# Patient Record
Sex: Male | Born: 1960 | Race: White | Hispanic: No | Marital: Married | Smoking: Never smoker
Health system: Southern US, Community
[De-identification: ages and names within clinical notes are randomized; demographics above are authoritative.]

## PROBLEM LIST (undated history)

## (undated) DIAGNOSIS — E785 Hyperlipidemia, unspecified: Secondary | ICD-10-CM

## (undated) DIAGNOSIS — K635 Polyp of colon: Secondary | ICD-10-CM

## (undated) DIAGNOSIS — L570 Actinic keratosis: Secondary | ICD-10-CM

## (undated) DIAGNOSIS — Z860100 Personal history of colon polyps, unspecified: Secondary | ICD-10-CM

## (undated) DIAGNOSIS — Z806 Family history of leukemia: Secondary | ICD-10-CM

## (undated) DIAGNOSIS — Z803 Family history of malignant neoplasm of breast: Secondary | ICD-10-CM

## (undated) DIAGNOSIS — Z800 Family history of malignant neoplasm of digestive organs: Secondary | ICD-10-CM

## (undated) DIAGNOSIS — I10 Essential (primary) hypertension: Secondary | ICD-10-CM

## (undated) DIAGNOSIS — D229 Melanocytic nevi, unspecified: Secondary | ICD-10-CM

## (undated) DIAGNOSIS — R42 Dizziness and giddiness: Secondary | ICD-10-CM

## (undated) DIAGNOSIS — Z8042 Family history of malignant neoplasm of prostate: Secondary | ICD-10-CM

## (undated) DIAGNOSIS — E119 Type 2 diabetes mellitus without complications: Secondary | ICD-10-CM

## (undated) DIAGNOSIS — K2270 Barrett's esophagus without dysplasia: Secondary | ICD-10-CM

## (undated) HISTORY — PX: ESOPHAGOGASTRODUODENOSCOPY: SHX1529

## (undated) HISTORY — PX: VASECTOMY: SHX75

## (undated) HISTORY — PX: COLONOSCOPY: SHX174

## (undated) HISTORY — DX: Family history of malignant neoplasm of breast: Z80.3

## (undated) HISTORY — DX: Type 2 diabetes mellitus without complications: E11.9

## (undated) HISTORY — DX: Melanocytic nevi, unspecified: D22.9

## (undated) HISTORY — DX: Family history of malignant neoplasm of prostate: Z80.42

## (undated) HISTORY — DX: Family history of leukemia: Z80.6

## (undated) HISTORY — DX: Family history of malignant neoplasm of digestive organs: Z80.0

## (undated) HISTORY — DX: Personal history of colonic polyps: Z86.010

## (undated) HISTORY — DX: Personal history of colon polyps, unspecified: Z86.0100

## (undated) HISTORY — DX: Actinic keratosis: L57.0

---

## 2007-05-09 ENCOUNTER — Encounter: Payer: Self-pay | Admitting: Dermatology

## 2007-05-12 ENCOUNTER — Ambulatory Visit: Payer: Self-pay | Admitting: Unknown Physician Specialty

## 2009-06-13 ENCOUNTER — Ambulatory Visit: Payer: Self-pay | Admitting: Unknown Physician Specialty

## 2010-02-09 ENCOUNTER — Ambulatory Visit: Payer: Self-pay | Admitting: Internal Medicine

## 2010-02-09 ENCOUNTER — Inpatient Hospital Stay: Payer: Self-pay | Admitting: Surgery

## 2011-06-25 ENCOUNTER — Ambulatory Visit: Payer: Self-pay | Admitting: Unknown Physician Specialty

## 2011-06-30 LAB — PATHOLOGY REPORT

## 2011-07-12 IMAGING — CT CT ABD-PELV W/ CM
1 of 2 series · 15 of 32 positions shown, 19 images · IV contrast (isovue)
Comparison: None

REASON FOR EXAM: ADD ON CR  [id number] LLQ  and abd pain
COMMENTS:

PROCEDURE:     CT  - CT ABDOMEN / PELVIS  W  - February 09, 2010  [DATE]
RESULT:     History: Abdominal pain
TECHNIQUE: Multiple axial images of the abdomen and pelvis were performed
from the lung bases to the pubic symphysis, with p.o. contrast and with 100
ml of Isovue 370 intravenous contrast.

[Series 2: 3mm soft tissue · axial · 0.79mm/px · z∈[-590,-94]mm · 15 of 181 slices shown, 19 images]
[im 8/181  soft-tissue]
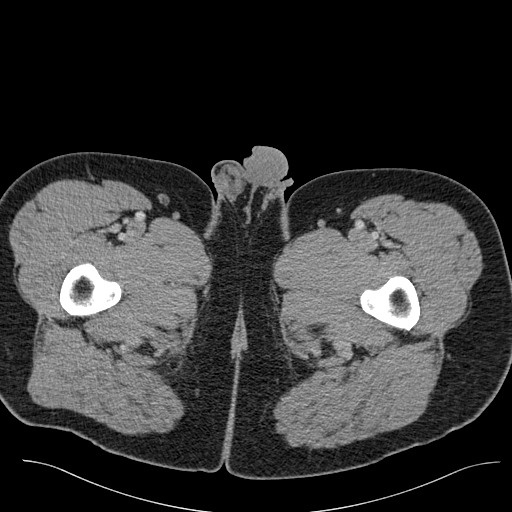
[im 8/181  bone]
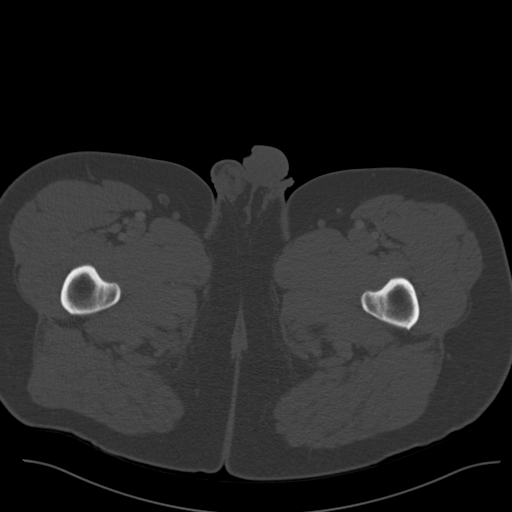
[im 24/181  soft-tissue]
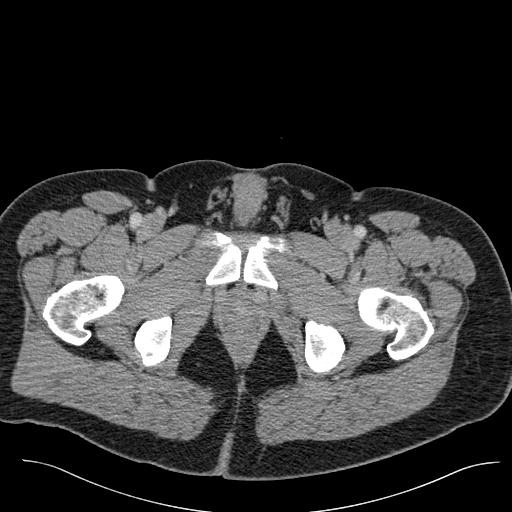
[im 40/181  soft-tissue]
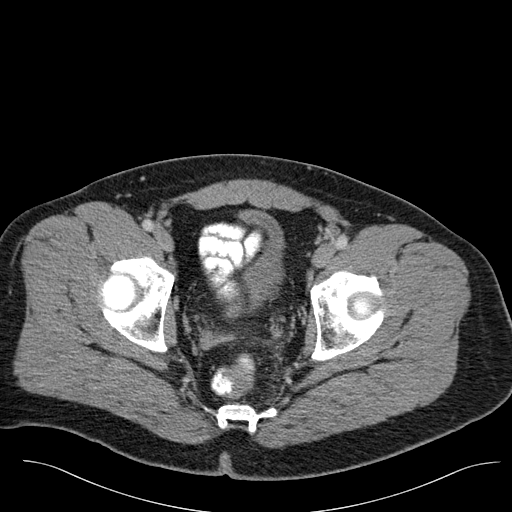
[im 47/181  soft-tissue]
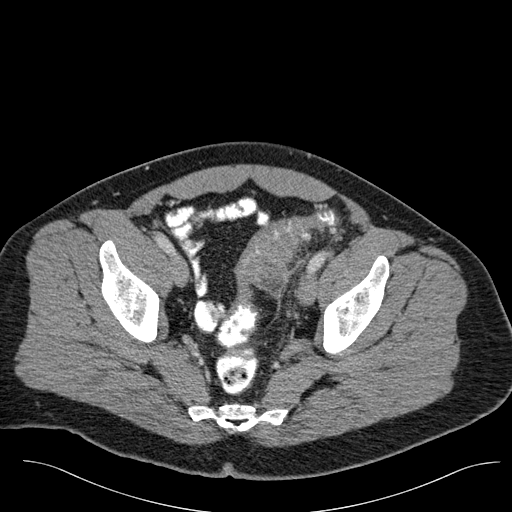
[im 63/181  soft-tissue]
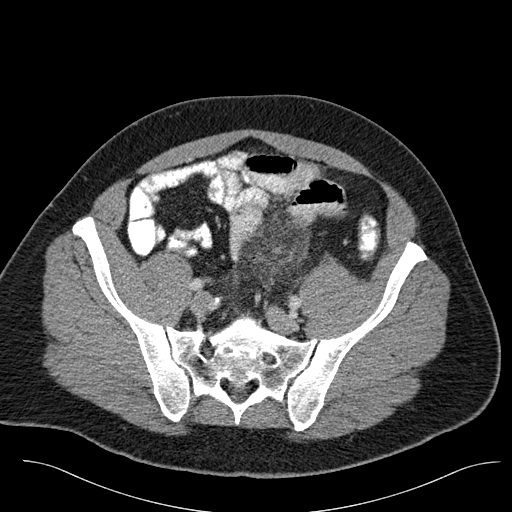
[im 79/181  soft-tissue]
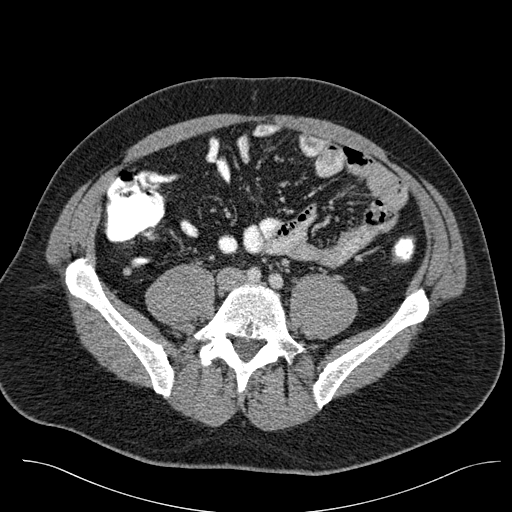
[im 94/181  soft-tissue]
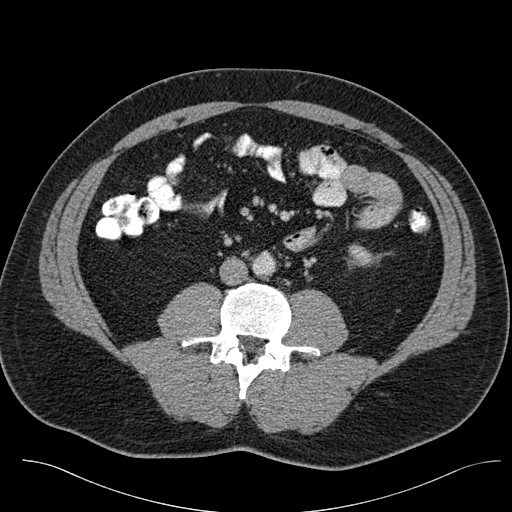
[im 102/181  soft-tissue]
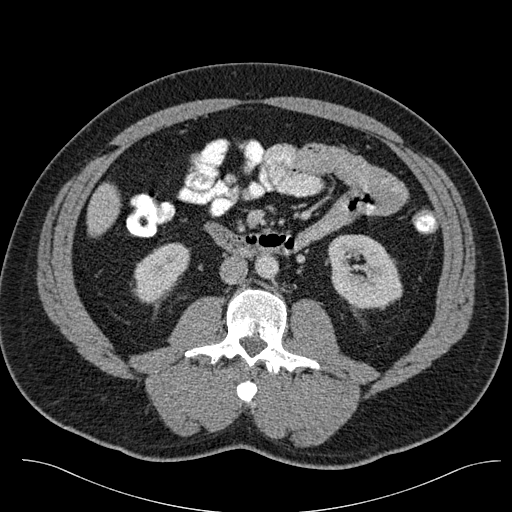
[im 118/181  soft-tissue]
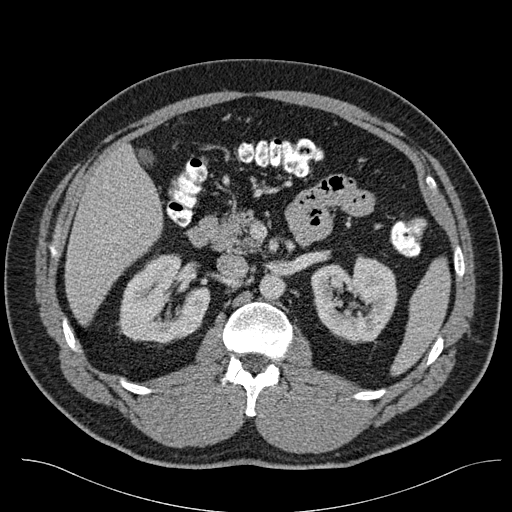
[im 118/181  bone]
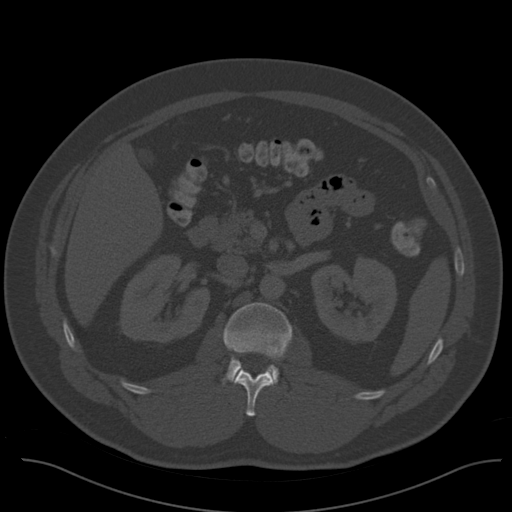
[im 134/181  soft-tissue]
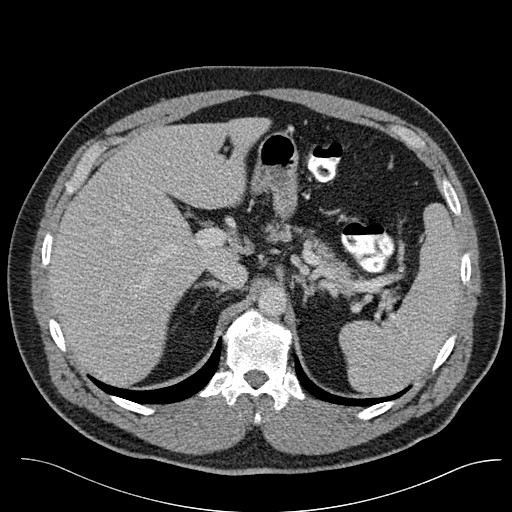
[im 141/181  soft-tissue]
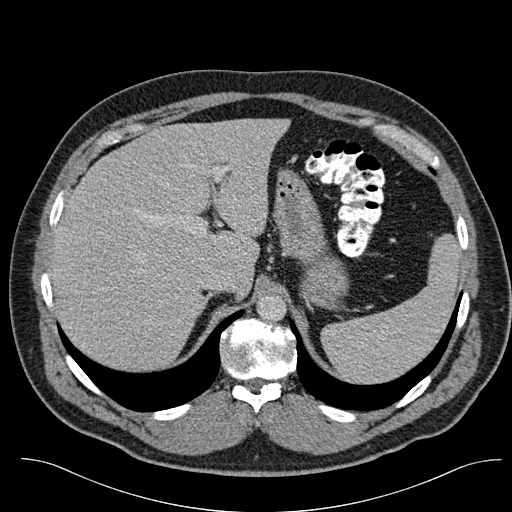
[im 149/181  lung]
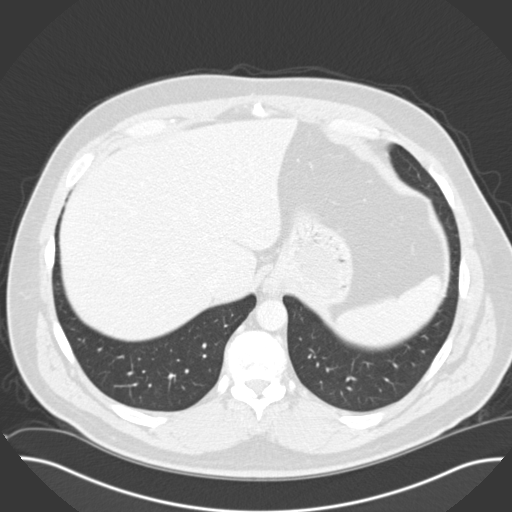
[im 157/181  soft-tissue]
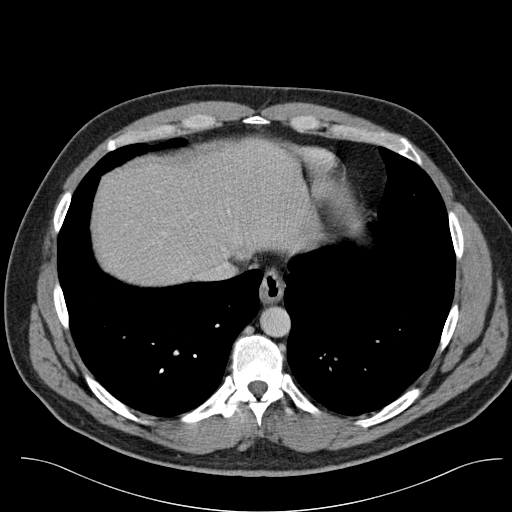
[im 157/181  lung]
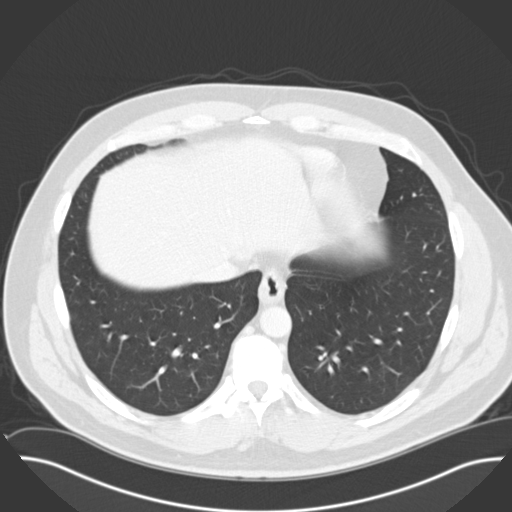
[im 165/181  lung]
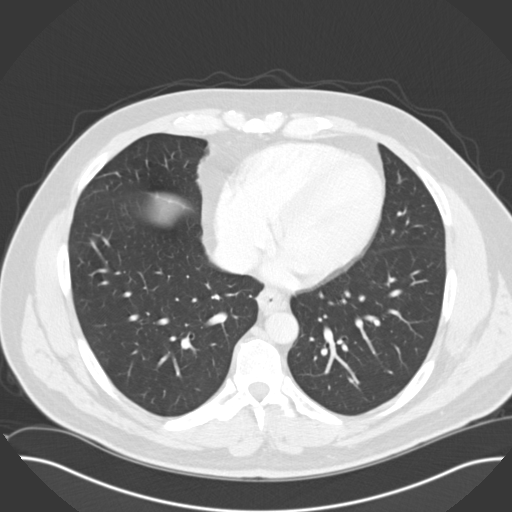
[im 173/181  soft-tissue]
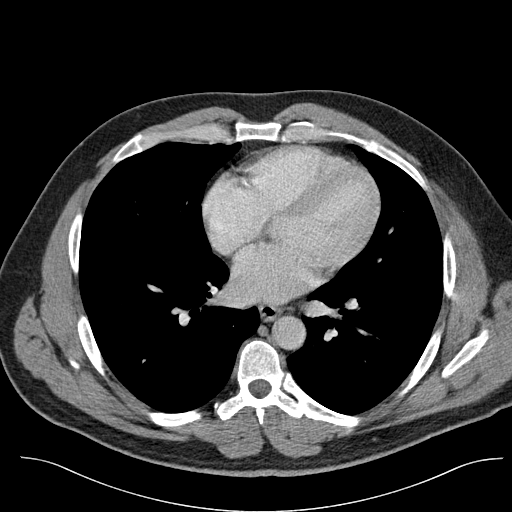
[im 173/181  lung]
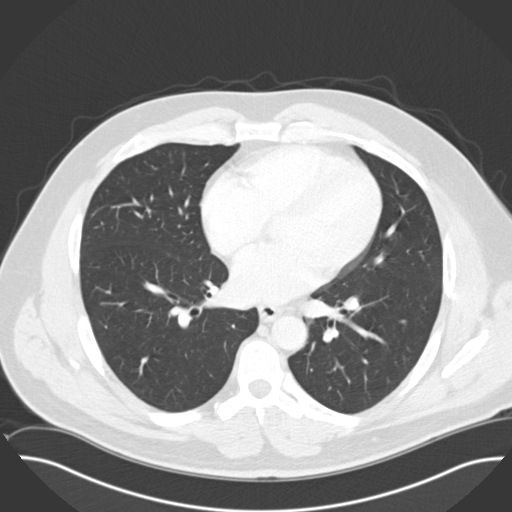

[15 of 32 positions shown; findings below may reference images not displayed]

FINDINGS: The lung bases are clear. There is no pneumothorax. The heart size is
normal.

The liver demonstrates no focal abnormality. There is no intrahepatic or
extrahepatic biliary ductal dilatation. The gallbladder is unremarkable. The
spleen demonstrates no focal abnormality. The kidneys, adrenal glands, and
pancreas are normal. The bladder is unremarkable.

There is bowel wall thickening involving the sigmoid colon with a few
diverticula in this region. There is perisigmoidal inflammatory change.
There is a a focal rounded area of inflammation adjacent to the sigmoid
colon with multiple locules of air within it likely representing a contained
perforation. There is no focal drainable fluid collection. There is no
pneumatosis, or portal venous gas. There is no abdominal or pelvic free
fluid. There is no lymphadenopathy.

The abdominal aorta is normal in caliber.

The osseous structures are unremarkable.
IMPRESSION: There is bowel wall thickening involving the sigmoid colon with
perisigmoidal inflammatory changes with a few diverticula in this region
likely representing diverticulitis and less likely colitis. There is a focal
rounded area of inflammation adjacent to the sigmoid colon with multiple
locules of air within it likely representing a contained perforation. There
is no focal drainable fluid collection. Surgical consultation may be helpful.

These findings were communicated to Dr. Arizaga on 02/09/2010 at 3703 hours.

## 2013-05-10 ENCOUNTER — Encounter: Payer: Self-pay | Admitting: Dermatology

## 2013-08-24 ENCOUNTER — Ambulatory Visit: Payer: Self-pay | Admitting: Unknown Physician Specialty

## 2013-08-29 LAB — PATHOLOGY REPORT

## 2014-01-10 DIAGNOSIS — K2270 Barrett's esophagus without dysplasia: Secondary | ICD-10-CM | POA: Insufficient documentation

## 2015-05-22 ENCOUNTER — Encounter: Payer: Self-pay | Admitting: Dermatology

## 2016-04-15 DIAGNOSIS — Z8249 Family history of ischemic heart disease and other diseases of the circulatory system: Secondary | ICD-10-CM | POA: Insufficient documentation

## 2016-04-15 DIAGNOSIS — E782 Mixed hyperlipidemia: Secondary | ICD-10-CM | POA: Insufficient documentation

## 2016-04-15 DIAGNOSIS — I10 Essential (primary) hypertension: Secondary | ICD-10-CM | POA: Insufficient documentation

## 2016-09-17 ENCOUNTER — Ambulatory Visit: Payer: PRIVATE HEALTH INSURANCE | Admitting: Anesthesiology

## 2016-09-17 ENCOUNTER — Ambulatory Visit
Admission: RE | Admit: 2016-09-17 | Discharge: 2016-09-17 | Disposition: A | Discharge status: Home / Self Care | Payer: PRIVATE HEALTH INSURANCE | Source: Ambulatory Visit | Attending: Unknown Physician Specialty | Admitting: Unknown Physician Specialty

## 2016-09-17 ENCOUNTER — Encounter
Admission: RE | Disposition: A | Discharge status: Home / Self Care | Payer: Self-pay | Source: Ambulatory Visit | Attending: Unknown Physician Specialty

## 2016-09-17 DIAGNOSIS — E785 Hyperlipidemia, unspecified: Secondary | ICD-10-CM | POA: Diagnosis not present

## 2016-09-17 DIAGNOSIS — K5730 Diverticulosis of large intestine without perforation or abscess without bleeding: Secondary | ICD-10-CM | POA: Diagnosis not present

## 2016-09-17 DIAGNOSIS — K2270 Barrett's esophagus without dysplasia: Secondary | ICD-10-CM | POA: Insufficient documentation

## 2016-09-17 DIAGNOSIS — Z79899 Other long term (current) drug therapy: Secondary | ICD-10-CM | POA: Insufficient documentation

## 2016-09-17 DIAGNOSIS — K640 First degree hemorrhoids: Secondary | ICD-10-CM | POA: Insufficient documentation

## 2016-09-17 DIAGNOSIS — I10 Essential (primary) hypertension: Secondary | ICD-10-CM | POA: Insufficient documentation

## 2016-09-17 DIAGNOSIS — Z1211 Encounter for screening for malignant neoplasm of colon: Secondary | ICD-10-CM | POA: Insufficient documentation

## 2016-09-17 DIAGNOSIS — Z86010 Personal history of colonic polyps: Secondary | ICD-10-CM | POA: Diagnosis not present

## 2016-09-17 DIAGNOSIS — Z7982 Long term (current) use of aspirin: Secondary | ICD-10-CM | POA: Diagnosis not present

## 2016-09-17 DIAGNOSIS — K2950 Unspecified chronic gastritis without bleeding: Secondary | ICD-10-CM | POA: Diagnosis not present

## 2016-09-17 HISTORY — PX: ESOPHAGOGASTRODUODENOSCOPY (EGD) WITH PROPOFOL: SHX5813

## 2016-09-17 HISTORY — DX: Hyperlipidemia, unspecified: E78.5

## 2016-09-17 HISTORY — PX: COLONOSCOPY WITH PROPOFOL: SHX5780

## 2016-09-17 HISTORY — DX: Polyp of colon: K63.5

## 2016-09-17 HISTORY — DX: Essential (primary) hypertension: I10

## 2016-09-17 HISTORY — DX: Barrett's esophagus without dysplasia: K22.70

## 2016-09-17 SURGERY — COLONOSCOPY WITH PROPOFOL
Anesthesia: General

## 2016-09-17 MED ORDER — FENTANYL CITRATE (PF) 100 MCG/2ML IJ SOLN
INTRAMUSCULAR | Status: AC
  Filled 2016-09-17: qty 2

## 2016-09-17 MED ORDER — SODIUM CHLORIDE 0.9 % IV SOLN
INTRAVENOUS | Status: DC
  Administered 2016-09-17: 1000 mL via INTRAVENOUS

## 2016-09-17 MED ORDER — FENTANYL CITRATE (PF) 100 MCG/2ML IJ SOLN
INTRAMUSCULAR | Status: DC | PRN
  Administered 2016-09-17: 100 ug via INTRAVENOUS

## 2016-09-17 MED ORDER — EPHEDRINE SULFATE 50 MG/ML IJ SOLN
INTRAMUSCULAR | Status: DC | PRN
  Administered 2016-09-17: 10 mg via INTRAVENOUS

## 2016-09-17 MED ORDER — MIDAZOLAM HCL 2 MG/2ML IJ SOLN
INTRAMUSCULAR | Status: DC | PRN
  Administered 2016-09-17: 2 mg via INTRAVENOUS

## 2016-09-17 MED ORDER — PROPOFOL 500 MG/50ML IV EMUL
INTRAVENOUS | Status: DC | PRN
  Administered 2016-09-17: 120 ug/kg/min via INTRAVENOUS

## 2016-09-17 MED ORDER — SODIUM CHLORIDE 0.9 % IV SOLN: INTRAVENOUS | Status: DC

## 2016-09-17 MED ORDER — PROPOFOL 10 MG/ML IV BOLUS
INTRAVENOUS | Status: DC | PRN
  Administered 2016-09-17 (×2): 40 mg via INTRAVENOUS

## 2016-09-17 MED ORDER — PROPOFOL 500 MG/50ML IV EMUL
INTRAVENOUS | Status: AC
  Filled 2016-09-17: qty 50

## 2016-09-17 MED ORDER — PHENYLEPHRINE HCL 10 MG/ML IJ SOLN
INTRAMUSCULAR | Status: DC | PRN
  Administered 2016-09-17 (×3): 100 ug via INTRAVENOUS

## 2016-09-17 MED ORDER — EPHEDRINE 5 MG/ML INJ
INTRAVENOUS | Status: AC
  Filled 2016-09-17: qty 10

## 2016-09-17 MED ORDER — PHENYLEPHRINE HCL 10 MG/ML IJ SOLN
INTRAMUSCULAR | Status: AC
  Filled 2016-09-17: qty 1

## 2016-09-17 MED ORDER — MIDAZOLAM HCL 2 MG/2ML IJ SOLN
INTRAMUSCULAR | Status: AC
  Filled 2016-09-17: qty 2

## 2016-09-17 NOTE — H&P (Signed)
   Primary Care Physician:  Rusty Aus, MD Primary Gastroenterologist:  Dr. Vira Agar  Pre-Procedure History & Physical: HPI:  Derrick Obrien is a 56 y.o. male is here for an endoscopy and colonoscopy.   Past Medical History:  Diagnosis Date  . Barrett esophagus   . Colon polyps   . Hyperlipidemia   . Hypertension     Past Surgical History:  Procedure Laterality Date  . COLONOSCOPY    . ESOPHAGOGASTRODUODENOSCOPY      Prior to Admission medications   Medication Sig Start Date End Date Taking? Authorizing Provider  aspirin EC 81 MG tablet Take 81 mg by mouth daily.   Yes Historical Provider, MD  ezetimibe-simvastatin (VYTORIN) 10-40 MG tablet Take 1 tablet by mouth daily.   Yes Historical Provider, MD  Niacin, Antihyperlipidemic, (NIASPAN PO) Take by mouth.   Yes Historical Provider, MD  omega-3 acid ethyl esters (LOVAZA) 1 g capsule Take by mouth 2 (two) times daily.   Yes Historical Provider, MD  OMEPRAZOLE PO Take by mouth.   Yes Historical Provider, MD  Telmisartan (MICARDIS PO) Take by mouth.   Yes Historical Provider, MD    Allergies as of 08/26/2016  . (Not on File)    History reviewed. No pertinent family history.  Social History   Social History  . Marital status: Married    Spouse name: N/A  . Number of children: N/A  . Years of education: N/A   Occupational History  . Not on file.   Social History Main Topics  . Smoking status: Never Smoker  . Smokeless tobacco: Never Used  . Alcohol use No  . Drug use: No  . Sexual activity: Not on file   Other Topics Concern  . Not on file   Social History Narrative  . No narrative on file    Review of Systems: See HPI, otherwise negative ROS  Physical Exam: BP (!) 147/82   Pulse 72   Temp 97.4 F (36.3 C) (Tympanic)   Resp 16   Ht 6' (1.829 m)   Wt 114.3 kg (252 lb)   SpO2 98%   BMI 34.18 kg/m  General:   Alert,  pleasant and cooperative in NAD Head:  Normocephalic and atraumatic. Neck:   Supple; no masses or thyromegaly. Lungs:  Clear throughout to auscultation.    Heart:  Regular rate and rhythm. Abdomen:  Soft, nontender and nondistended. Normal bowel sounds, without guarding, and without rebound.   Neurologic:  Alert and  oriented x4;  grossly normal neurologically.  Impression/Plan: Derrick Obrien is here for an endoscopy and colonoscopy to be performed for Barretts esophagus and personal history of colon polyps.  Risks, benefits, limitations, and alternatives regarding  endoscopy and colonoscopy have been reviewed with the patient.  Questions have been answered.  All parties agreeable.   Gaylyn Cheers, MD  09/17/2016, 10:59 AM

## 2016-09-17 NOTE — Op Note (Addendum)
[facility] Gastroenterology Patient Name: Derrick Obrien Procedure Date: 09/17/2016 10:37 AM MRN: [id number] Account #: [id number] Date of Birth: 1960/12/27 Admit Type: Outpatient Age: 56 Room: [facility] ENDO ROOM 1 Gender: Male Note Status: Finalized Procedure:            Colonoscopy Indications:          High risk colon cancer surveillance: Personal history                        of colonic polyps Providers:            Manya Silvas, MD Medicines:            Propofol per Anesthesia Complications:        No immediate complications. Procedure:            Pre-Anesthesia Assessment:                       - After reviewing the risks and benefits, the patient                        was deemed in satisfactory condition to undergo the                        procedure.                       After obtaining informed consent, the colonoscope was                        passed under direct vision. Throughout the procedure,                        the patient's blood pressure, pulse, and oxygen                        saturations were monitored continuously. The                        Colonoscope was introduced through the anus and                        advanced to the the cecum, identified by appendiceal                        orifice and ileocecal valve. Findings:      Internal hemorrhoids were found during endoscopy. The hemorrhoids were       small and Grade I (internal hemorrhoids that do not prolapse).      Prostate was normal on exam      Minimal scattered erythematous spots in sigmoid likely of a harmless       nature, a biopsy was done of the most prominent area.      The exam was otherwise without abnormality.      A few very small-mouthed diverticula were found in the sigmoid colon. Impression:           - Internal hemorrhoids.                       - The examination was otherwise normal.                       -  No specimens collected. Recommendation:       - Await  pathology results. Manya Silvas, MD 09/17/2016 11:46:45 AM This report has been signed electronically. Number of Addenda: 0 Note Initiated On: 09/17/2016 10:37 AM Scope Withdrawal Time: 0 hours 10 minutes 6 seconds  Total Procedure Duration: 0 hours 15 minutes 9 seconds       [facility]

## 2016-09-17 NOTE — Op Note (Addendum)
[facility] Gastroenterology Patient Name: Derrick Obrien Procedure Date: 09/17/2016 10:38 AM MRN: [id number] Account #: [id number] Date of Birth: Jun 26, 1961 Admit Type: Outpatient Age: 56 Room: [facility] ENDO ROOM 1 Gender: Male Note Status: Finalized Procedure:            Upper GI endoscopy Indications:          Follow-up of Barrett's esophagus Providers:            Manya Silvas, MD Referring MD:         Rusty Aus, MD (Referring MD) Medicines:            Propofol per Anesthesia Complications:        No immediate complications. Procedure:            Pre-Anesthesia Assessment:                       - After reviewing the risks and benefits, the patient                        was deemed in satisfactory condition to undergo the                        procedure.                       After obtaining informed consent, the endoscope was                        passed under direct vision. Throughout the procedure,                        the patient's blood pressure, pulse, and oxygen                        saturations were monitored continuously. The Endoscope                        was introduced through the mouth, and advanced to the                        second part of duodenum. The upper GI endoscopy was                        accomplished without difficulty. The patient tolerated                        the procedure well. Findings:      There were esophageal mucosal changes secondary to established       short-segment Barrett's disease present at the gastroesophageal       junction. The maximum longitudinal extent of these mucosal changes was       1-2 cm in length. Mucosa was biopsied with a cold forceps for histology.       One specimen bottle was sent to pathology. GEJ 41cm.      Diffuse mild inflammation characterized by erythema and slight       granularity was found in the gastric antrum. Biopsies were taken with a       cold forceps for histology.  Biopsies were taken with a cold forceps for       Helicobacter pylori testing.  Diffuse granular mucosa was found in the duodenal bulb. Impression:           - Esophageal mucosal changes secondary to established                        short-segment Barrett's disease. Biopsied.                       - Gastritis. Biopsied.                       - Granular mucosa in the duodenal bulb. Continue                        medication. Recommendation:       - Await pathology results. Manya Silvas, MD 09/17/2016 11:24:59 AM This report has been signed electronically. Number of Addenda: 0 Note Initiated On: 09/17/2016 10:38 AM      [facility]

## 2016-09-17 NOTE — Transfer of Care (Signed)
Immediate Anesthesia Transfer of Care Note  Patient: Derrick Obrien  Procedure(s) Performed: Procedure(s): COLONOSCOPY WITH PROPOFOL (N/A) ESOPHAGOGASTRODUODENOSCOPY (EGD) WITH PROPOFOL (N/A)  Patient Location: PACU  Anesthesia Type:General  Level of Consciousness: awake  Airway & Oxygen Therapy: Patient Spontanous Breathing and Patient connected to nasal cannula oxygen  Post-op Assessment: Report given to RN and Post -op Vital signs reviewed and stable  Post vital signs: Reviewed  Last Vitals:  Vitals:   09/17/16 1030  BP: (!) 147/82  Pulse: 72  Resp: 16  Temp: 36.3 C    Last Pain:  Vitals:   09/17/16 1030  TempSrc: Tympanic         Complications: No apparent anesthesia complications

## 2016-09-17 NOTE — Anesthesia Postprocedure Evaluation (Signed)
Anesthesia Post Note  Patient: Derrick Obrien  Procedure(s) Performed: Procedure(s) (LRB): COLONOSCOPY WITH PROPOFOL (N/A) ESOPHAGOGASTRODUODENOSCOPY (EGD) WITH PROPOFOL (N/A)  Patient location during evaluation: PACU Anesthesia Type: General Level of consciousness: awake Pain management: pain level controlled Vital Signs Assessment: post-procedure vital signs reviewed and stable Respiratory status: spontaneous breathing Cardiovascular status: stable Anesthetic complications: no     Last Vitals:  Vitals:   09/17/16 1205 09/17/16 1215  BP: 121/72 122/72  Pulse:    Resp:    Temp:      Last Pain:  Vitals:   09/17/16 1145  TempSrc: Tympanic                 VAN STAVEREN,Yojan Paskett

## 2016-09-17 NOTE — Anesthesia Preprocedure Evaluation (Signed)
Anesthesia Evaluation  Patient identified by MRN, date of birth, ID band Patient awake    Reviewed: Allergy & Precautions, NPO status , Patient's Chart, lab work & pertinent test results  Airway Mallampati: III       Dental  (+) Teeth Intact   Pulmonary neg pulmonary ROS,    breath sounds clear to auscultation       Cardiovascular hypertension, Pt. on medications  Rhythm:Regular Rate:Normal     Neuro/Psych negative neurological ROS     GI/Hepatic negative GI ROS, Neg liver ROS,   Endo/Other  negative endocrine ROS  Renal/GU negative Renal ROS     Musculoskeletal negative musculoskeletal ROS (+)   Abdominal (+) + obese,   Peds negative pediatric ROS (+)  Hematology negative hematology ROS (+)   Anesthesia Other Findings   Reproductive/Obstetrics                             Anesthesia Physical Anesthesia Plan  ASA: II  Anesthesia Plan: General   Post-op Pain Management:    Induction: Intravenous  Airway Management Planned: Natural Airway and Nasal Cannula  Additional Equipment:   Intra-op Plan:   Post-operative Plan:   Informed Consent: I have reviewed the patients History and Physical, chart, labs and discussed the procedure including the risks, benefits and alternatives for the proposed anesthesia with the patient or authorized representative who has indicated his/her understanding and acceptance.     Plan Discussed with: Surgeon  Anesthesia Plan Comments:         Anesthesia Quick Evaluation

## 2016-09-17 NOTE — Anesthesia Post-op Follow-up Note (Signed)
Anesthesia QCDR form completed.        

## 2016-09-20 ENCOUNTER — Encounter: Payer: Self-pay | Admitting: Unknown Physician Specialty

## 2016-09-20 LAB — SURGICAL PATHOLOGY

## 2017-10-20 DIAGNOSIS — E119 Type 2 diabetes mellitus without complications: Secondary | ICD-10-CM | POA: Insufficient documentation

## 2017-11-04 ENCOUNTER — Encounter: Payer: PRIVATE HEALTH INSURANCE | Attending: Internal Medicine | Admitting: *Deleted

## 2017-11-04 ENCOUNTER — Encounter: Payer: Self-pay | Admitting: *Deleted

## 2017-11-04 VITALS — BP 132/90 | Ht 72.0 in | Wt 249.9 lb

## 2017-11-04 DIAGNOSIS — E119 Type 2 diabetes mellitus without complications: Secondary | ICD-10-CM | POA: Diagnosis present

## 2017-11-04 DIAGNOSIS — Z713 Dietary counseling and surveillance: Secondary | ICD-10-CM | POA: Diagnosis present

## 2017-11-04 NOTE — Patient Instructions (Signed)
Check blood sugars 1 x day before breakfast or 2 hrs after on meal 3-4 x week Bring blood sugar records to the next class  Exercise: Begin walking  for 15  minutes   3  days a week and gradually increase to 30 minutes 5 x week  Eat 3 meals day,   1-2 snacks a day Space meals 4-6 hours apart Don't skip meals Avoid sugar sweetened drinks (slushes)  Make an eye doctor appointment  Return for classes on:

## 2017-11-04 NOTE — Progress Notes (Signed)
Diabetes Self-Management Education  Visit Type: First/Initial  Appt. Start Time: 1435 Appt. End Time: 4656  11/04/2017  [name], identified by name and date of birth, is a 57 y.o. male with a diagnosis of Diabetes: Type 2.   ASSESSMENT  Blood pressure 132/90, height 6' (1.829 m), weight 249 lb 14.4 oz (113.4 kg). Body mass index is 33.89 kg/m.  Diabetes Self-Management Education - 11/04/17 1631      Visit Information   Visit Type  First/Initial      Initial Visit   Diabetes Type  Type 2    Are you currently following a meal plan?  Yes    What type of meal plan do you follow?  "less carbs, more proteins and vegetables"    Are you taking your medications as prescribed?  Yes    Date Diagnosed  2 weeks      Health Coping   How would you rate your overall health?  Good;Fair      Psychosocial Assessment   Patient Belief/Attitude about Diabetes  Motivated to manage diabetes "disappointed in myself"    Self-care barriers  None    Self-management support  Doctor's office;Family    Other persons present  Spouse/SO    Patient Concerns  Nutrition/Meal planning;Glycemic Control;Medication;Monitoring;Healthy Lifestyle;Weight Control    Special Needs  None    Preferred Learning Style  Auditory;Visual    Learning Readiness  Change in progress    How often do you need to have someone help you when you read instructions, pamphlets, or other written materials from your doctor or pharmacy?  1 - Never    What is the last grade level you completed in school?  doctor      Pre-Education Assessment   Patient understands the diabetes disease and treatment process.  Needs Instruction    Patient understands incorporating nutritional management into lifestyle.  Needs Instruction    Patient undertands incorporating physical activity into lifestyle.  Needs Instruction    Patient understands using medications safely.  Needs Instruction    Patient understands monitoring blood glucose,  interpreting and using results  Needs Review    Patient understands prevention, detection, and treatment of acute complications.  Needs Instruction    Patient understands prevention, detection, and treatment of chronic complications.  Needs Instruction    Patient understands how to develop strategies to address psychosocial issues.  Needs Instruction    Patient understands how to develop strategies to promote health/change behavior.  Needs Instruction      Complications   Last HgB A1C per patient/outside source  6.9 % 10/11/17    How often do you check your blood sugar?  3-4 times / week    Fasting Blood glucose range (mg/dL)  70-129;130-179 FBG's 109-131 mg/dL    Postprandial Blood glucose range (mg/dL)  <70;70-129 pp's 63-96 mg/dL    Have you had a dilated eye exam in the past 12 months?  No    Have you had a dental exam in the past 12 months?  Yes    Are you checking your feet?  No      Dietary Intake   Breakfast  2 eggs, sausage, cheese; roast beef - usually some type of protein    Lunch  roll up of cheese, [place]; grilled chicken; hummus and chips; meat loaf    Dinner  fish, beef, pork, chicken with asparagus or other non-starchy veggie    Beverage(s)  water, 2 x week [facility] slushee  Exercise   Exercise Type  ADL's      Patient Education   Previous Diabetes Education  No    Disease state   Factors that contribute to the development of diabetes    Nutrition management   Role of diet in the treatment of diabetes and the relationship between the three main macronutrients and blood glucose level;Food label reading, portion sizes and measuring food.;Carbohydrate counting;Reviewed blood glucose goals for pre and post meals and how to evaluate the patients' food intake on their blood glucose level.    Physical activity and exercise   Role of exercise on diabetes management, blood pressure control and cardiac health.    Medications  Reviewed patients medication for diabetes,  action, purpose, timing of dose and side effects.    Monitoring  Purpose and frequency of SMBG.;Taught/discussed recording of test results and interpretation of SMBG.;Identified appropriate SMBG and/or A1C goals.    Chronic complications  Relationship between chronic complications and blood glucose control;Retinopathy and reason for yearly dilated eye exams    Psychosocial adjustment  Role of stress on diabetes;Identified and addressed patients feelings and concerns about diabetes      Individualized Goals (developed by patient)   Reducing Risk  Improve blood sugars Decrease medications Prevent diabetes complications Lose weight Lead a healthier lifestyle Become more fit     Outcomes   Expected Outcomes  Demonstrated interest in learning. Expect positive outcomes       Individualized Plan for Diabetes Self-Management Training:   Learning Objective:  Patient will have a greater understanding of diabetes self-management. Patient education plan is to attend individual and/or group sessions per assessed needs and concerns.   Plan:   Patient Instructions  Check blood sugars 1 x day before breakfast or 2 hrs after on meal 3-4 x week Bring blood sugar records to the next class Exercise: Begin walking  for 15  minutes   3  days a week and gradually increase to 30 minutes 5 x week Eat 3 meals day,   1-2 snacks a day Space meals 4-6 hours apart Don't skip meals Avoid sugar sweetened drinks (slushes) Make an eye doctor appointment   Expected Outcomes:  Demonstrated interest in learning. Expect positive outcomes  Education material provided:  General Meal Planning Guidelines Simple Meal Plan  If problems or questions, patient to contact team via:  Johny Drilling, RN, CCM, CDE [phone]  Future DSME appointment:  Patient and wife to check their calendars and will call back to schedule classes.

## 2017-12-13 ENCOUNTER — Encounter: Payer: Self-pay | Admitting: *Deleted

## 2018-10-05 ENCOUNTER — Encounter: Payer: Self-pay | Admitting: Genetic Counselor

## 2018-10-05 ENCOUNTER — Telehealth: Payer: Self-pay | Admitting: Genetic Counselor

## 2018-10-05 NOTE — Telephone Encounter (Signed)
A genetic counseling appt has been scheduled for the pt to see Steele Berg on 3/19 at 2pm. Letter mailed to the pt.

## 2018-11-02 ENCOUNTER — Other Ambulatory Visit: Payer: PRIVATE HEALTH INSURANCE

## 2019-04-16 ENCOUNTER — Telehealth: Payer: Self-pay | Admitting: Genetic Counselor

## 2019-04-16 NOTE — Telephone Encounter (Signed)
A genetic counseling appt has been scheduled w/the pt's wife on 10/8 at 4pm for a webex visit. Mrs. Gozman has been made aware someone will call to setup the appt.

## 2019-04-17 ENCOUNTER — Encounter: Payer: Self-pay | Admitting: Genetic Counselor

## 2019-05-24 ENCOUNTER — Inpatient Hospital Stay: Payer: BC Managed Care – PPO | Attending: Genetic Counselor | Admitting: Genetic Counselor

## 2019-05-24 ENCOUNTER — Telehealth: Payer: Self-pay | Admitting: Genetic Counselor

## 2019-05-24 DIAGNOSIS — Z806 Family history of leukemia: Secondary | ICD-10-CM

## 2019-05-24 DIAGNOSIS — Z803 Family history of malignant neoplasm of breast: Secondary | ICD-10-CM

## 2019-05-24 DIAGNOSIS — Z86010 Personal history of colonic polyps: Secondary | ICD-10-CM

## 2019-05-24 DIAGNOSIS — Z8042 Family history of malignant neoplasm of prostate: Secondary | ICD-10-CM

## 2019-05-24 DIAGNOSIS — Z800 Family history of malignant neoplasm of digestive organs: Secondary | ICD-10-CM

## 2019-05-24 NOTE — Telephone Encounter (Signed)
Spoke with wife, Amy, to confirm and set up the webex link for Dr. Josetta Huddle genetic counseling visit this afternoon. An webex invitation has been sent.

## 2019-05-25 ENCOUNTER — Encounter: Payer: Self-pay | Admitting: Genetic Counselor

## 2019-05-25 DIAGNOSIS — Z806 Family history of leukemia: Secondary | ICD-10-CM | POA: Insufficient documentation

## 2019-05-25 DIAGNOSIS — Z800 Family history of malignant neoplasm of digestive organs: Secondary | ICD-10-CM | POA: Insufficient documentation

## 2019-05-25 DIAGNOSIS — Z860100 Personal history of colon polyps, unspecified: Secondary | ICD-10-CM | POA: Insufficient documentation

## 2019-05-25 DIAGNOSIS — Z803 Family history of malignant neoplasm of breast: Secondary | ICD-10-CM | POA: Insufficient documentation

## 2019-05-25 DIAGNOSIS — Z8042 Family history of malignant neoplasm of prostate: Secondary | ICD-10-CM | POA: Insufficient documentation

## 2019-05-25 NOTE — Progress Notes (Signed)
REFERRING PROVIDER: Raynelle Bring, MD [address] [place],  [place] [address]  PRIMARY PROVIDER:  Rusty Aus, MD  PRIMARY REASON FOR VISIT:  1. Family history of prostate cancer   2. Personal history of colonic polyps   3. Family history of pancreatic cancer   4. Family history of breast cancer   5. Family history of leukemia     I connected with Mr. Westbrook on 05/25/2019 at 4:00pm EDT by Webex video conference and verified that I am speaking with the correct person using two identifiers. His sister, Sandra Martinique, participated in this visit as well.  Patient location: work Provider location: clinic   HISTORY OF PRESENT ILLNESS:   Mr. Ashmead, a 58 y.o. male, was seen for a  cancer genetics consultation at the request of Dr. Alinda Money due to a personal history of colon polyps and a family history of metastatic prostate cancer, pancreatic cancer, and breast cancer.  Mr. Velardi presents to clinic today to discuss the possibility of a hereditary predisposition to cancer, genetic testing, and to further clarify his future cancer risks, as well as potential cancer risks for family members.   Mr. Geisen is a 58 y.o. male with no personal history of cancer.  In 1997, at the age of 26, Mr. 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 The next year (1998), he was found to have a hyperplastic rectal polyp. He has since received colonoscopies every few years. In 2010, two tubular adenomas were identified in the ascending colon.   Summary of colonoscopy results: 09/17/2016 No polyps  08/24/2013 No polyps  06/25/2011 No polyps Nodule in esophagus: reflux esophagitis  06/13/2009 2 sessile polyps in ascending colon: tubular adenomas  05/12/2007 No polyps  04/18/2004 No polyps  09/15/2001 No polyps  09/12/1999 No polyps  08/06/1997 1 rectal polyp: hyperplastic  08/10/1996 1 polyp in sigmoid colon: tubulo-villous adenoma    CANCER HISTORY:  Oncology History   No history exists.    Past Medical History:  Diagnosis Date   Barrett esophagus    Colon polyps    Diabetes mellitus without complication ([place])    Family history of breast cancer    Family history of leukemia    Family history of pancreatic cancer    Family history of prostate cancer    Hyperlipidemia    Hypertension    Personal history of colonic polyps     Past Surgical History:  Procedure Laterality Date   COLONOSCOPY     COLONOSCOPY WITH PROPOFOL N/A 09/17/2016   Procedure: COLONOSCOPY WITH PROPOFOL;  Surgeon: Manya Silvas, MD;  Location: [facility];  Service: Endoscopy;  Laterality: N/A;   ESOPHAGOGASTRODUODENOSCOPY     ESOPHAGOGASTRODUODENOSCOPY (EGD) WITH PROPOFOL N/A 09/17/2016   Procedure: ESOPHAGOGASTRODUODENOSCOPY (EGD) WITH PROPOFOL;  Surgeon: Manya Silvas, MD;  Location: [facility];  Service: Endoscopy;  Laterality: N/A;   VASECTOMY      Social History   Socioeconomic History   Marital status: Married    Spouse name: Not on file   Number of children: Not on file   Years of education: Not on file   Highest education level: Not on file  Occupational History   Not on file  Social Needs   Financial resource strain: Not on file   Food insecurity    Worry: Not on file    Inability: Not on file   Transportation needs    Medical: Not on file  Non-medical: Not on file  Tobacco Use   Smoking status: Never Smoker   Smokeless tobacco: Never Used  Substance and Sexual Activity   Alcohol use: No    Comment: maybe 1 drink/month   Drug use: No   Sexual activity: Not on file  Lifestyle   Physical activity    Days per week: Not on file    Minutes per session: Not on file   Stress: Not on file  Relationships   Social connections    Talks on phone: Not on file    Gets together: Not on file    Attends religious service: Not on file    Active member of club or organization: Not on file    Attends meetings of clubs or  organizations: Not on file    Relationship status: Not on file  Other Topics Concern   Not on file  Social History Narrative   Not on file     FAMILY HISTORY:  We obtained a detailed, 4-generation family history.  Significant diagnoses are listed below: Family History  Problem Relation Age of Onset   Diabetes Maternal Grandfather    Pancreatic cancer Maternal Grandfather 59   Heart disease Father    Prostate cancer Brother 66       metastatic   Breast cancer Maternal Aunt        dx. early 12s   Heart disease Paternal Aunt    Acute lymphoblastic leukemia Niece 4   Heart disease Cousin        paternal cousin, died in his 72s   Heart attack Cousin 61       paternal cousin    Mr. Magos has two children - a daughter who is 75 and a son who is 58. He had one brother who died at the age of 29 due to metastatic prostate cancer, which was initially diagnosed at the age of 62. He also has one sister who is 67 and has had two normal colonoscopies and no history of cancer. Mr. Lipari has a niece (daughter of his brother) who had leukemia (ALL) at the age of 85.  Mr. Kemmerling mother is 63 and has not had cancer. He has one maternal aunt who is 36 and had breast cancer diagnosed in her early 53s. His maternal grandfather had pancreatic cancer diagnosed at age 35. This grandfather also had several siblings who died of cancer, one being in their early 85s when they died. Mr. Corbitt is unsure what type of cancer these relatives had. His maternal grandmother died at the age of 42 without a diagnosis of cancer.  Mr. Schiff father died at the age of 75 due to heart disease. He had one paternal aunt, who died at the age of 6 of heart disease. Two of his paternal cousins have had heart problems as well - one who died in his 17s from heart disease and one who had a heart attack at age 28. Mr. Holtsclaw paternal grandfather died in his early 33s, and his paternal grandmother died in her 11s. There are  no known diagnoses of cancer on the paternal side of the family.  Mr. Rafter is unaware of previous family history of genetic testing for hereditary cancer risks. Patient's maternal ancestors are of [place] descent, and paternal ancestors are of [place] descent. There is no reported Ashkenazi Jewish ancestry. There is no known consanguinity.  GENETIC COUNSELING ASSESSMENT: Mr. Lyles is a 58 y.o. male with a family history of metastatic prostate cancer,  pancreatic cancer, and breast cancer, which is somewhat suggestive of a hereditary cancer syndrome. We, therefore, discussed and recommended the following at today's visit.   DISCUSSION: We discussed that 5 - 10% of cancer is hereditary. Most hereditary cases of breast, pancreatic, and prostate cancer are associated with the BRCA1/2 genes.  We discussed that testing is beneficial for several reasons including knowing about other cancer risks, identifying potential screening and risk-reduction options that may be appropriate, and to understand if other family members could be at risk for cancer and allow them to undergo genetic testing.   We reviewed the characteristics, features and inheritance patterns of hereditary cancer syndromes. We also discussed genetic testing, including the appropriate family members to test, the process of testing, insurance coverage and turn-around-time for results. We discussed the implications of a negative, positive and/or variant of uncertain significant result. We recommended Mr. Rockhill pursue genetic testing for the [facility] Multi-Cancer panel.   The Multi-Cancer Panel offered by [facility] includes sequencing and/or deletion duplication testing of the following 85 genes: AIP, ALK, APC, ATM, AXIN2,BAP1,  BARD1, BLM, BMPR1A, BRCA1, BRCA2, BRIP1, CASR, CDC73, CDH1, CDK4, CDKN1B, CDKN1C, CDKN2A (p14ARF), CDKN2A (p16INK4a), CEBPA, CHEK2, CTNNA1, DICER1, DIS3L2, EGFR (c.2369C>T, p.Thr790Met variant only), EPCAM (Deletion/duplication  testing only), FH, FLCN, GATA2, GPC3, GREM1 (Promoter region deletion/duplication testing only), HOXB13 (c.251G>A, p.Gly84Glu), HRAS, KIT, MAX, MEN1, MET, MITF (c.952G>A, p.Glu318Lys variant only), MLH1, MSH2, MSH3, MSH6, MUTYH, NBN, NF1, NF2, NTHL1, PALB2, PDGFRA, PHOX2B, PMS2, POLD1, POLE, POT1, PRKAR1A, PTCH1, PTEN, RAD50, RAD51C, RAD51D, RB1, RECQL4, RET, RNF43, RUNX1, SDHAF2, SDHA (sequence changes only), SDHB, SDHC, SDHD, SMAD4, SMARCA4, SMARCB1, SMARCE1, STK11, SUFU, TERC, TERT, TMEM127, TP53, TSC1, TSC2, VHL, WRN and WT1.    Based on Mr. Edman family history of cancer, he meets medical criteria for genetic testing. Despite that he meets criteria, he may still have an out of pocket cost. We discussed that if his out of pocket cost for testing is over $100, the laboratory will call and confirm whether he wants to proceed with testing.  If the out of pocket cost of testing is less than $100 he will be billed by the genetic testing laboratory.   We discussed that some people do not want to undergo genetic testing due to fear of genetic discrimination.  A federal law called the Genetic Information Non-Discrimination Act (GINA) of 2008 helps protect individuals against genetic discrimination based on their genetic test results.  It impacts both health insurance and employment.  With health insurance, it protects against increased premiums, being kicked off insurance or being forced to take a test in order to be insured.  For employment it protects against hiring, firing and promoting decisions based on genetic test results.  Health status due to a cancer diagnosis is not protected under GINA.  Additionally, life, disability, and long-term care insurance is not protected under GINA.   PLAN: After considering the risks, benefits, and limitations, Mr. Morrical provided informed consent to pursue genetic testing. A saliva kit will be sent to Mr. Kulzer work address, and he will be responsible for sending the  sample to [facility] for analysis of the Multi-Cancer panel. After the lab receives the sample, results should be available within approximately two-three weeks' time, at which point they will be disclosed by telephone to Mr. Debarr, as will any additional recommendations warranted by these results. Mr. Aubry will receive a summary of his genetic counseling visit and a copy of his results once available. This information will also be  available in [place].   Mr. Belmares questions were answered to his satisfaction today. Our contact information was provided should additional questions or concerns arise. Thank you for the referral and allowing [place] to share in the care of your patient.   Clint Guy, MS, [facility] Certified Genetic Counselor [email or web address] Phone: [phone]  The patient was seen for a total of 60 minutes in face-to-face genetic counseling.  This patient was discussed with Drs. Magrinat, Lindi Adie and/or Burr Medico who agrees with the above.    _______________________________________________________________________ For Office Staff:  Number of people involved in session: 3 Was an Intern/ student involved with case: no

## 2019-06-22 ENCOUNTER — Telehealth: Payer: Self-pay | Admitting: Genetic Counselor

## 2019-06-22 NOTE — Telephone Encounter (Signed)
Spoke with Mr. Sokalski wife, Amy, about billing information for his genetic testing. Insurance will not cover the test if Mr. Dooly maternal aunt is available for testing. They are comfortable paying the self-pay price of $250.

## 2019-06-26 DIAGNOSIS — Z1379 Encounter for other screening for genetic and chromosomal anomalies: Secondary | ICD-10-CM | POA: Insufficient documentation

## 2019-06-28 ENCOUNTER — Ambulatory Visit: Payer: Self-pay | Admitting: Genetic Counselor

## 2019-06-28 ENCOUNTER — Telehealth: Payer: Self-pay | Admitting: Genetic Counselor

## 2019-06-28 DIAGNOSIS — Z1379 Encounter for other screening for genetic and chromosomal anomalies: Secondary | ICD-10-CM

## 2019-06-28 NOTE — Telephone Encounter (Signed)
LVM that his genetic test results are available. I will call back tomorrow after 1:30pm to discuss these results, or he is welcome to call us back if he is available between now and then.

## 2019-06-28 NOTE — Telephone Encounter (Signed)
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  Discussed that we do not know why he has had colon polyps at a young age or why there is cancer in the family.  It is possible that there is a hereditary cause for the cancer in the family that he did not inherit.  It could also be due to a different gene that we are not testing, or our current technology may not be able detect something.  It will be important for him to keep in contact with genetics to keep up with whether additional testing may be appropriate in the future. Testing did detect a variant of uncertain significance in the POLD1 gene, called c.3049A>G.  His result is still considered normal, and this variant should not impact his medical management.

## 2019-06-29 ENCOUNTER — Encounter: Payer: Self-pay | Admitting: Genetic Counselor

## 2019-06-29 NOTE — Progress Notes (Signed)
HPI:  Derrick Obrien was previously seen in the [facility] clinic due to a family history of metastatic prostate cancer, breast cancer, and pancreatic cancer, and concerns regarding a hereditary predisposition to cancer. Please refer to our prior cancer genetics clinic note for more information regarding our discussion, assessment and recommendations, at the time. Derrick Obrien recent genetic test results were disclosed to him, as were recommendations warranted by these results. These results and recommendations are discussed in more detail below.  CANCER HISTORY:  Oncology History   No history exists.    FAMILY HISTORY:  We obtained a detailed, 4-generation family history.  Significant diagnoses are listed below: Family History  Problem Relation Age of Onset  . Diabetes Maternal Grandfather   . Pancreatic cancer Maternal Grandfather 88  . Heart disease Father   . Prostate cancer Brother 38       metastatic  . Breast cancer Maternal Aunt        dx. early 11s  . Heart disease Paternal Aunt   . Acute lymphoblastic leukemia Niece 4  . Heart disease Cousin        paternal cousin, died in his 61s  . Heart attack Cousin 32       paternal cousin     Derrick Obrien has two children - a daughter who is 59 and a son who is 80. He had one brother who died at the age of 54 due to metastatic prostate cancer, which was initially diagnosed at the age of 33. He also has one sister who is 8 and has had two normal colonoscopies and no history of cancer. Derrick Obrien has a niece (daughter of his brother) who had leukemia (ALL) at the age of 13.  Derrick Obrien mother is 85 and has not had cancer. He has one maternal aunt who is 42 and had breast cancer diagnosed in her early 65s. His maternal grandfather had pancreatic cancer diagnosed at age 69. This grandfather also had several siblings who died of cancer, one being in their early 37s when they died. Derrick Obrien is unsure what type of cancer these  relatives had. His maternal grandmother died at the age of 58 without a diagnosis of cancer.  Derrick Obrien father died at the age of 40 due to heart disease. He had one paternal aunt, who died at the age of 54 of heart disease. Two of his paternal cousins have had heart problems as well - one who died in his 78s from heart disease and one who had a heart attack at age 38. Derrick Obrien paternal grandfather died in his early 34s, and his paternal grandmother died in her 40s. There are no known diagnoses of cancer on the paternal side of the family.  Derrick Obrien is unaware of previous family history of genetic testing for hereditary cancer risks. Patient's maternal ancestors are of [place] descent, and paternal ancestors are of [place] descent. There is no reported Ashkenazi Jewish ancestry. There is no known consanguinity.  GENETIC TEST RESULTS: Genetic testing reported out on 06/26/2019 through the [facility] Multi-Cancer panel found no pathogenic variants.   The Multi-Cancer Panel offered by [facility] includes sequencing and/or deletion duplication testing of the following 85 genes: AIP, ALK, APC, ATM, AXIN2,BAP1,  BARD1, BLM, BMPR1A, BRCA1, BRCA2, BRIP1, CASR, CDC73, CDH1, CDK4, CDKN1B, CDKN1C, CDKN2A (p14ARF), CDKN2A (p16INK4a), CEBPA, CHEK2, CTNNA1, DICER1, DIS3L2, EGFR (c.2369C>T, p.Thr790Met variant only), EPCAM (Deletion/duplication testing only), FH, FLCN, GATA2, GPC3, GREM1 (Promoter region deletion/duplication testing only), HOXB13 (  c.251G>A, p.Gly84Glu), HRAS, KIT, MAX, MEN1, MET, MITF (c.952G>A, p.Glu318Lys variant only), MLH1, MSH2, MSH3, MSH6, MUTYH, NBN, NF1, NF2, NTHL1, PALB2, PDGFRA, PHOX2B, PMS2, POLD1, POLE, POT1, PRKAR1A, PTCH1, PTEN, RAD50, RAD51C, RAD51D, RB1, RECQL4, RET, RNF43, RUNX1, SDHAF2, SDHA (sequence changes only), SDHB, SDHC, SDHD, SMAD4, SMARCA4, SMARCB1, SMARCE1, STK11, SUFU, TERC, TERT, TMEM127, TP53, TSC1, TSC2, VHL, WRN and WT1. The test report will be scanned into EPIC and  located under the Molecular Pathology section of the Results Review tab.  A portion of the result report is included below for reference.     We discussed with Derrick Obrien that because current genetic testing is not perfect, it is possible there may be a gene mutation in one of these genes that current testing cannot detect, but that chance is small.  We also discussed, that there could be another gene that has not yet been discovered, or that we have not yet tested, that is responsible for the cancer diagnoses in the family. It is also possible there is a hereditary cause for the cancer in the family that Derrick Obrien did not inherit and therefore was not identified in his testing.  Therefore, it is important to remain in touch with cancer genetics in the future so that we can continue to offer Derrick Obrien the most up to date genetic testing.   Genetic testing did identify a variant of uncertain significance (VUS) was identified in the POLD1 gene called c.3049A>G.  At this time, it is unknown if this variant is associated with increased cancer risk or if this is a normal finding, but most variants such as this get reclassified to being inconsequential. It should not be used to make medical management decisions. With time, we suspect the lab will determine the significance of this variant, if any. If we do learn more about it, we will try to contact Derrick Obrien to discuss it further. However, it is important to stay in touch with [place] periodically and keep the address and phone number up to date.  ADDITIONAL GENETIC TESTING:  We discussed with Derrick Obrien that his genetic testing was fairly extensive.  If there are genes identified to increase cancer risk that can be analyzed in the future, we would be happy to discuss and coordinate this testing at that time.    CANCER SCREENING RECOMMENDATIONS: Derrick Obrien test result is considered negative (normal).  This means that we have not identified a hereditary cause for his  family history of cancer at this time. Most cancers happen by chance and this negative test suggests that the cancer in the family may fall into this category.    While reassuring, this does not definitively rule out a hereditary predisposition to cancer. It is still possible that there could be genetic mutations that are undetectable by current technology. There could be genetic mutations in genes that have not been tested or identified to increase cancer risk.  Therefore, it is recommended he continue to follow the cancer management and screening guidelines provided by his primary healthcare provider.   An individual's cancer risk and medical management are not determined by genetic test results alone. Overall cancer risk assessment incorporates additional factors, including personal medical history, family history, and any available genetic information that may result in a personalized plan for cancer prevention and surveillance.  RECOMMENDATIONS FOR FAMILY MEMBERS:  Individuals in this family might be at some increased risk of developing cancer, over the general population risk, simply due to the family history of  cancer.  We recommended women in this family have a yearly mammogram beginning at age 37, or 77 years younger than the earliest onset of cancer, an annual clinical breast exam, and perform monthly breast self-exams. Women in this family should also have a gynecological exam as recommended by their primary provider. All family members should have a colonoscopy by age 4.  FOLLOW-UP: Lastly, we discussed with Mr. Sweigert that cancer genetics is a rapidly advancing field and it is possible that new genetic tests will be appropriate for him and/or his family members in the future. We encouraged him to remain in contact with cancer genetics on an annual basis so we can update his personal and family histories and let him know of advances in cancer genetics that may benefit this family.   Our contact  number was provided. Mr. Dingee questions were answered to his satisfaction, and he knows he is welcome to call us at anytime with additional questions or concerns.   Clint Guy, MS, [facility] Certified Genetic Counselor [email or web address] Phone: [phone]

## 2019-07-02 ENCOUNTER — Encounter: Payer: Self-pay | Admitting: Genetic Counselor

## 2020-05-01 ENCOUNTER — Ambulatory Visit: Payer: BC Managed Care – PPO | Admitting: Dermatology

## 2020-05-01 ENCOUNTER — Other Ambulatory Visit: Payer: Self-pay

## 2020-05-01 ENCOUNTER — Encounter: Payer: Self-pay | Admitting: Dermatology

## 2020-05-01 DIAGNOSIS — D229 Melanocytic nevi, unspecified: Secondary | ICD-10-CM | POA: Diagnosis not present

## 2020-05-01 DIAGNOSIS — L570 Actinic keratosis: Secondary | ICD-10-CM

## 2020-05-01 DIAGNOSIS — Z1283 Encounter for screening for malignant neoplasm of skin: Secondary | ICD-10-CM

## 2020-05-01 DIAGNOSIS — D1800 Hemangioma unspecified site: Secondary | ICD-10-CM | POA: Diagnosis not present

## 2020-05-01 DIAGNOSIS — Z86018 Personal history of other benign neoplasm: Secondary | ICD-10-CM

## 2020-05-01 DIAGNOSIS — L814 Other melanin hyperpigmentation: Secondary | ICD-10-CM

## 2020-05-01 DIAGNOSIS — L578 Other skin changes due to chronic exposure to nonionizing radiation: Secondary | ICD-10-CM

## 2020-05-01 DIAGNOSIS — L821 Other seborrheic keratosis: Secondary | ICD-10-CM

## 2020-05-01 NOTE — Progress Notes (Signed)
   Follow-Up Visit   Subjective  Derrick Obrien is a 59 y.o. male who presents for the following: Annual Exam (yearly TBSE, Hx of Dysplastic nevus on the Left 5th finger).  Pt c/o scaly places on his face. The patient presents for Total-Body Skin Exam (TBSE) for skin cancer screening and mole check.  The following portions of the chart were reviewed this encounter and updated as appropriate:  Tobacco  Allergies  Meds  Problems  Med Hx  Surg Hx  Fam Hx     Review of Systems:  No other skin or systemic complaints except as noted in HPI or Assessment and Plan.  Objective  Well appearing patient in no apparent distress; mood and affect are within normal limits.  A full examination was performed including scalp, head, eyes, ears, nose, lips, neck, chest, axillae, abdomen, back, buttocks, bilateral upper extremities, bilateral lower extremities, hands, feet, fingers, toes, fingernails, and toenails. All findings within normal limits unless otherwise noted below.  Objective  Left 5th finger: Scar with no evidence of recurrence.   Objective  Head - Anterior (Face) (3): Erythematous thin papules/macules with gritty scale.    Assessment & Plan  History of dysplastic nevus Left 5th finger  Clear. Observe for recurrence. Call clinic for new or changing lesions.  Recommend regular skin exams, daily broad-spectrum spf 30+ sunscreen use, and photoprotection.     AK (actinic keratosis) (3) Head - Anterior (Face)  If not gone in 2 months return to the office. May consider treating with a topical or LN2  Destruction of lesion - Head - Anterior (Face) Complexity: simple   Destruction method: cryotherapy   Informed consent: discussed and consent obtained   Timeout:  patient name, date of birth, surgical site, and procedure verified Lesion destroyed using liquid nitrogen: Yes   Region frozen until ice ball extended beyond lesion: Yes   Outcome: patient tolerated procedure well with no  complications   Post-procedure details: wound care instructions given    Skin cancer screening  Lentigines - Scattered tan macules - Discussed due to sun exposure - Benign, observe - Call for any changes  Seborrheic Keratoses - Stuck-on, waxy, tan-brown papules and plaques  - Discussed benign etiology and prognosis. - Observe - Call for any changes  Melanocytic Nevi - Tan-brown and/or pink-flesh-colored symmetric macules and papules - Benign appearing on exam today - Observation - Call clinic for new or changing moles - Recommend daily use of broad spectrum spf 30+ sunscreen to sun-exposed areas.   Hemangiomas - Red papules - Discussed benign nature - Observe - Call for any changes  Actinic Damage - diffuse scaly erythematous macules with underlying dyspigmentation - Recommend daily broad spectrum sunscreen SPF 30+ to sun-exposed areas, reapply every 2 hours as needed.  - Call for new or changing lesions.  Skin cancer screening performed today.  Return in about 1 year (around 05/01/2021) for TBSE, HX of Dysplastic nevus .  IMarye Round, CMA, am acting as scribe for Sarina Ser, MD .  Documentation: I have reviewed the above documentation for accuracy and completeness, and I agree with the above.  Sarina Ser, MD

## 2020-05-01 NOTE — Patient Instructions (Signed)
Cryotherapy Aftercare  . Wash gently with soap and water everyday.   . Apply Vaseline and Band-Aid daily until healed. Recommend daily broad spectrum sunscreen SPF 30+ to sun-exposed areas, reapply every 2 hours as needed. Call for new or changing lesions.  

## 2020-05-05 ENCOUNTER — Encounter: Payer: Self-pay | Admitting: Dermatology

## 2020-06-06 ENCOUNTER — Other Ambulatory Visit: Payer: Self-pay

## 2020-06-06 ENCOUNTER — Telehealth (INDEPENDENT_AMBULATORY_CARE_PROVIDER_SITE_OTHER): Payer: Self-pay | Admitting: Gastroenterology

## 2020-06-06 DIAGNOSIS — K22719 Barrett's esophagus with dysplasia, unspecified: Secondary | ICD-10-CM

## 2020-06-06 DIAGNOSIS — Z860100 Personal history of colon polyps, unspecified: Secondary | ICD-10-CM

## 2020-06-06 MED ORDER — PEG 3350-KCL-NA BICARB-NACL 420 G PO SOLR: 4000.0000 mL | Freq: Once | ORAL | 0 refills | Status: AC

## 2020-06-06 NOTE — Progress Notes (Signed)
Gastroenterology Pre-Procedure Review  Request Date: Friday 07/18/20 Requesting Physician: Dr. Allen Norris  PATIENT REVIEW QUESTIONS: The patient responded to the following health history questions as indicated:    1. Are you having any GI issues? no 2. Do you have a personal history of Polyps? yes (History of colon polyps noted on Feb 2018 Colonoscopy.  First colonoscopy was at the age of 67-34 performed by Dr. Sonny Masters colon cancerous polyps noted.  Pt states from then he has been having colonoscopy every 3 years all performed by Dr. Vira Agar.) 3. Do you have a family history of Colon Cancer or Polyps? no 4. Diabetes Mellitus? no 5. Joint replacements in the past 12 months?no 6. Major health problems in the past 3 months?no 7. Any artificial heart valves, MVP, or defibrillator?no    MEDICATIONS & ALLERGIES:    Patient reports the following regarding taking any anticoagulation/antiplatelet therapy:   Plavix, Coumadin, Eliquis, Xarelto, Lovenox, Pradaxa, Brilinta, or Effient? no Aspirin? yes (81 mg daily)  Patient confirms/reports the following medications:  Current Outpatient Medications  Medication Sig Dispense Refill  . aspirin EC 81 MG tablet Take 81 mg by mouth daily.    Marland Kitchen ezetimibe-simvastatin (VYTORIN) 10-40 MG tablet Take 1 tablet by mouth daily.    . Multiple Vitamin (MULTI-VITAMINS) TABS Take 1 tablet by mouth daily.    . Niacin, Antihyperlipidemic, (NIASPAN PO) Take 500 mg by mouth daily.     Marland Kitchen omega-3 acid ethyl esters (LOVAZA) 1 g capsule Take 1 g by mouth daily.     Marland Kitchen OMEPRAZOLE PO Take 40 mg by mouth 2 (two) times daily.     . Telmisartan (MICARDIS PO) Take 1 tablet by mouth daily.     . metFORMIN (GLUCOPHAGE) 500 MG tablet Take 500 mg by mouth daily.    . polyethylene glycol-electrolytes (NULYTELY) 420 g solution Take 4,000 mLs by mouth once for 1 dose. Follow preparation instructions.  Drink 8 oz every 30 minutes the evening before procedure between 5pm-6pm 4000 mL 0   No  current facility-administered medications for this visit.    Patient confirms/reports the following allergies:  No Known Allergies  Orders Placed This Encounter  Procedures  . Procedural/ Surgical Case Request: COLONOSCOPY WITH PROPOFOL, ESOPHAGOGASTRODUODENOSCOPY (EGD) WITH PROPOFOL    Standing Status:   Standing    Number of Occurrences:   1    Order Specific Question:   Pre-op diagnosis    Answer:   Personal history of colon polyps, Barretts Esophagus    Order Specific Question:   CPT Code    Answer:   [phone], [phone]    AUTHORIZATION INFORMATION Primary Insurance: 1D#: Group #:  Secondary Insurance: 1D#: Group #:  SCHEDULE INFORMATION: Date: Friday 07/18/20 Time: Location:[facility]

## 2020-07-09 ENCOUNTER — Other Ambulatory Visit: Payer: Self-pay

## 2020-07-09 ENCOUNTER — Encounter: Payer: Self-pay | Admitting: Gastroenterology

## 2020-07-16 ENCOUNTER — Other Ambulatory Visit
Admission: RE | Admit: 2020-07-16 | Discharge: 2020-07-16 | Disposition: A | Discharge status: Home / Self Care | Payer: BC Managed Care – PPO | Source: Ambulatory Visit | Attending: Gastroenterology | Admitting: Gastroenterology

## 2020-07-16 ENCOUNTER — Other Ambulatory Visit: Payer: Self-pay

## 2020-07-16 DIAGNOSIS — Z79899 Other long term (current) drug therapy: Secondary | ICD-10-CM | POA: Diagnosis not present

## 2020-07-16 DIAGNOSIS — Z20822 Contact with and (suspected) exposure to covid-19: Secondary | ICD-10-CM | POA: Insufficient documentation

## 2020-07-16 DIAGNOSIS — Z86010 Personal history of colonic polyps: Secondary | ICD-10-CM | POA: Diagnosis not present

## 2020-07-16 DIAGNOSIS — K635 Polyp of colon: Secondary | ICD-10-CM | POA: Diagnosis not present

## 2020-07-16 DIAGNOSIS — Z7982 Long term (current) use of aspirin: Secondary | ICD-10-CM | POA: Diagnosis not present

## 2020-07-16 DIAGNOSIS — K3189 Other diseases of stomach and duodenum: Secondary | ICD-10-CM | POA: Diagnosis not present

## 2020-07-16 DIAGNOSIS — K2270 Barrett's esophagus without dysplasia: Secondary | ICD-10-CM | POA: Diagnosis not present

## 2020-07-16 DIAGNOSIS — Z01812 Encounter for preprocedural laboratory examination: Secondary | ICD-10-CM | POA: Insufficient documentation

## 2020-07-16 DIAGNOSIS — Z1211 Encounter for screening for malignant neoplasm of colon: Secondary | ICD-10-CM | POA: Diagnosis present

## 2020-07-16 DIAGNOSIS — K5730 Diverticulosis of large intestine without perforation or abscess without bleeding: Secondary | ICD-10-CM | POA: Diagnosis not present

## 2020-07-16 DIAGNOSIS — Z7984 Long term (current) use of oral hypoglycemic drugs: Secondary | ICD-10-CM | POA: Diagnosis not present

## 2020-07-16 DIAGNOSIS — K2950 Unspecified chronic gastritis without bleeding: Secondary | ICD-10-CM | POA: Diagnosis not present

## 2020-07-16 DIAGNOSIS — K640 First degree hemorrhoids: Secondary | ICD-10-CM | POA: Diagnosis not present

## 2020-07-16 DIAGNOSIS — K2100 Gastro-esophageal reflux disease with esophagitis, without bleeding: Secondary | ICD-10-CM | POA: Diagnosis not present

## 2020-07-16 LAB — SARS CORONAVIRUS 2 (TAT 6-24 HRS): SARS Coronavirus 2: NEGATIVE

## 2020-07-17 NOTE — Discharge Instructions (Signed)
General Anesthesia, Adult, Care After This sheet gives you information about how to care for yourself after your procedure. Your health care provider may also give you more specific instructions. If you have problems or questions, contact your health care provider. What can I expect after the procedure? After the procedure, the following side effects are common:  Pain or discomfort at the IV site.  Nausea.  Vomiting.  Sore throat.  Trouble concentrating.  Feeling cold or chills.  Weak or tired.  Sleepiness and fatigue.  Soreness and body aches. These side effects can affect parts of the body that were not involved in surgery. Follow these instructions at home:  For at least 24 hours after the procedure:  Have a responsible adult stay with you. It is important to have someone help care for you until you are awake and alert.  Rest as needed.  Do not: ? Participate in activities in which you could fall or become injured. ? Drive. ? Use heavy machinery. ? Drink alcohol. ? Take sleeping pills or medicines that cause drowsiness. ? Make important decisions or sign legal documents. ? Take care of children on your own. Eating and drinking  Follow any instructions from your health care provider about eating or drinking restrictions.  When you feel hungry, start by eating small amounts of foods that are soft and easy to digest (bland), such as toast. Gradually return to your regular diet.  Drink enough fluid to keep your urine pale yellow.  If you vomit, rehydrate by drinking water, juice, or clear broth. General instructions  If you have sleep apnea, surgery and certain medicines can increase your risk for breathing problems. Follow instructions from your health care provider about wearing your sleep device: ? Anytime you are sleeping, including during daytime naps. ? While taking prescription pain medicines, sleeping medicines, or medicines that make you drowsy.  Return to  your normal activities as told by your health care provider. Ask your health care provider what activities are safe for you.  Take over-the-counter and prescription medicines only as told by your health care provider.  If you smoke, do not smoke without supervision.  Keep all follow-up visits as told by your health care provider. This is important. Contact a health care provider if:  You have nausea or vomiting that does not get better with medicine.  You cannot eat or drink without vomiting.  You have pain that does not get better with medicine.  You are unable to pass urine.  You develop a skin rash.  You have a fever.  You have redness around your IV site that gets worse. Get help right away if:  You have difficulty breathing.  You have chest pain.  You have blood in your urine or stool, or you vomit blood. Summary  After the procedure, it is common to have a sore throat or nausea. It is also common to feel tired.  Have a responsible adult stay with you for the first 24 hours after general anesthesia. It is important to have someone help care for you until you are awake and alert.  When you feel hungry, start by eating small amounts of foods that are soft and easy to digest (bland), such as toast. Gradually return to your regular diet.  Drink enough fluid to keep your urine pale yellow.  Return to your normal activities as told by your health care provider. Ask your health care provider what activities are safe for you. This information is not   intended to replace advice given to you by your health care provider. Make sure you discuss any questions you have with your health care provider. Document Revised: 08/05/2017 Document Reviewed: 03/18/2017 Elsevier Patient Education  2020 Elsevier Inc.  

## 2020-07-18 ENCOUNTER — Ambulatory Visit: Payer: BC Managed Care – PPO | Admitting: Anesthesiology

## 2020-07-18 ENCOUNTER — Encounter: Payer: Self-pay | Admitting: Gastroenterology

## 2020-07-18 ENCOUNTER — Encounter
Admission: RE | Disposition: A | Discharge status: Home / Self Care | Payer: Self-pay | Source: Ambulatory Visit | Attending: Gastroenterology

## 2020-07-18 ENCOUNTER — Other Ambulatory Visit: Payer: Self-pay

## 2020-07-18 ENCOUNTER — Ambulatory Visit
Admission: RE | Admit: 2020-07-18 | Discharge: 2020-07-18 | Disposition: A | Discharge status: Home / Self Care | Payer: BC Managed Care – PPO | Source: Ambulatory Visit | Attending: Gastroenterology | Admitting: Gastroenterology

## 2020-07-18 DIAGNOSIS — K2270 Barrett's esophagus without dysplasia: Secondary | ICD-10-CM | POA: Insufficient documentation

## 2020-07-18 DIAGNOSIS — K2950 Unspecified chronic gastritis without bleeding: Secondary | ICD-10-CM | POA: Insufficient documentation

## 2020-07-18 DIAGNOSIS — Z79899 Other long term (current) drug therapy: Secondary | ICD-10-CM | POA: Insufficient documentation

## 2020-07-18 DIAGNOSIS — K3189 Other diseases of stomach and duodenum: Secondary | ICD-10-CM | POA: Insufficient documentation

## 2020-07-18 DIAGNOSIS — Z1211 Encounter for screening for malignant neoplasm of colon: Secondary | ICD-10-CM | POA: Insufficient documentation

## 2020-07-18 DIAGNOSIS — K2100 Gastro-esophageal reflux disease with esophagitis, without bleeding: Secondary | ICD-10-CM | POA: Insufficient documentation

## 2020-07-18 DIAGNOSIS — Z20822 Contact with and (suspected) exposure to covid-19: Secondary | ICD-10-CM | POA: Insufficient documentation

## 2020-07-18 DIAGNOSIS — Z7982 Long term (current) use of aspirin: Secondary | ICD-10-CM | POA: Insufficient documentation

## 2020-07-18 DIAGNOSIS — K635 Polyp of colon: Secondary | ICD-10-CM | POA: Diagnosis not present

## 2020-07-18 DIAGNOSIS — Z86010 Personal history of colonic polyps: Secondary | ICD-10-CM | POA: Insufficient documentation

## 2020-07-18 DIAGNOSIS — Z7984 Long term (current) use of oral hypoglycemic drugs: Secondary | ICD-10-CM | POA: Insufficient documentation

## 2020-07-18 DIAGNOSIS — K5730 Diverticulosis of large intestine without perforation or abscess without bleeding: Secondary | ICD-10-CM | POA: Insufficient documentation

## 2020-07-18 DIAGNOSIS — K640 First degree hemorrhoids: Secondary | ICD-10-CM | POA: Insufficient documentation

## 2020-07-18 DIAGNOSIS — K22719 Barrett's esophagus with dysplasia, unspecified: Secondary | ICD-10-CM | POA: Diagnosis not present

## 2020-07-18 HISTORY — DX: Dizziness and giddiness: R42

## 2020-07-18 HISTORY — PX: POLYPECTOMY: SHX5525

## 2020-07-18 HISTORY — PX: ESOPHAGOGASTRODUODENOSCOPY (EGD) WITH PROPOFOL: SHX5813

## 2020-07-18 HISTORY — PX: COLONOSCOPY WITH PROPOFOL: SHX5780

## 2020-07-18 LAB — GLUCOSE, CAPILLARY
Glucose-Capillary: 131 mg/dL — ABNORMAL HIGH (ref 70–99)
Glucose-Capillary: 133 mg/dL — ABNORMAL HIGH (ref 70–99)

## 2020-07-18 SURGERY — COLONOSCOPY WITH PROPOFOL
Anesthesia: General | Site: Rectum

## 2020-07-18 MED ORDER — ONDANSETRON HCL 4 MG/2ML IJ SOLN: 4.0000 mg | Freq: Once | INTRAMUSCULAR | Status: DC | PRN

## 2020-07-18 MED ORDER — LACTATED RINGERS IV SOLN: INTRAVENOUS | Status: DC

## 2020-07-18 MED ORDER — STERILE WATER FOR IRRIGATION IR SOLN: Status: DC | PRN

## 2020-07-18 MED ORDER — GLYCOPYRROLATE 0.2 MG/ML IJ SOLN
INTRAMUSCULAR | Status: DC | PRN
  Administered 2020-07-18: .1 mg via INTRAVENOUS

## 2020-07-18 MED ORDER — LIDOCAINE HCL (CARDIAC) PF 100 MG/5ML IV SOSY
PREFILLED_SYRINGE | INTRAVENOUS | Status: DC | PRN
  Administered 2020-07-18: 30 mg via INTRAVENOUS

## 2020-07-18 MED ORDER — ACETAMINOPHEN 325 MG PO TABS: 325.0000 mg | ORAL_TABLET | ORAL | Status: DC | PRN

## 2020-07-18 MED ORDER — SODIUM CHLORIDE 0.9 % IV SOLN: INTRAVENOUS | Status: DC

## 2020-07-18 MED ORDER — ACETAMINOPHEN 160 MG/5ML PO SOLN: 325.0000 mg | ORAL | Status: DC | PRN

## 2020-07-18 MED ORDER — PROPOFOL 10 MG/ML IV BOLUS
INTRAVENOUS | Status: DC | PRN
  Administered 2020-07-18: 40 mg via INTRAVENOUS
  Administered 2020-07-18: 50 mg via INTRAVENOUS
  Administered 2020-07-18: 20 mg via INTRAVENOUS
  Administered 2020-07-18 (×2): 40 mg via INTRAVENOUS
  Administered 2020-07-18: 150 mg via INTRAVENOUS
  Administered 2020-07-18 (×2): 40 mg via INTRAVENOUS

## 2020-07-18 SURGICAL SUPPLY — 8 items
BLOCK BITE 60FR ADLT L/F GRN (MISCELLANEOUS) ×4 IMPLANT
FORCEPS BIOP RAD 4 LRG CAP 4 (CUTTING FORCEPS) ×4 IMPLANT
GOWN CVR UNV OPN BCK APRN NK (MISCELLANEOUS) ×4 IMPLANT
GOWN ISOL THUMB LOOP REG UNIV (MISCELLANEOUS) ×8
KIT PRC NS LF DISP ENDO (KITS) ×2 IMPLANT
KIT PROCEDURE OLYMPUS (KITS) ×4
MANIFOLD NEPTUNE II (INSTRUMENTS) ×4 IMPLANT
WATER STERILE IRR 250ML POUR (IV SOLUTION) ×4 IMPLANT

## 2020-07-18 NOTE — Op Note (Signed)
[facility] Gastroenterology Patient Name: Derrick Obrien Procedure Date: 07/18/2020 7:21 AM MRN: [id number] Account #: [id number] Date of Birth: [birth year] Admit Type: Outpatient Age: 59 Room: [facility] OR ROOM 01 Gender: Male Note Status: Finalized Procedure:             Upper GI endoscopy Indications:           Barrett's esophagus, Follow-up of Barrett's esophagus Providers:             Lucilla Lame MD, MD Referring MD:          Rusty Aus, MD (Referring MD) Medicines:             Propofol per Anesthesia Complications:         No immediate complications. Procedure:             Pre-Anesthesia Assessment:                        - Prior to the procedure, a History and Physical was                         performed, and patient medications and allergies were                         reviewed. The patient's tolerance of previous                         anesthesia was also reviewed. The risks and benefits                         of the procedure and the sedation options and risks                         were discussed with the patient. All questions were                         answered, and informed consent was obtained. Prior                         Anticoagulants: The patient has taken no previous                         anticoagulant or antiplatelet agents. ASA Grade                         Assessment: II - A patient with mild systemic disease.                         After reviewing the risks and benefits, the patient                         was deemed in satisfactory condition to undergo the                         procedure.                        After obtaining informed consent, the endoscope was  passed under direct vision. Throughout the procedure,                         the patient's blood pressure, pulse, and oxygen                         saturations were monitored continuously. The was                         introduced through the  mouth, and advanced to the                         second part of duodenum. The upper GI endoscopy was                         accomplished without difficulty. The patient tolerated                         the procedure well. Findings:      There were esophageal mucosal changes consistent with short-segment       Barrett's esophagus present in the lower third of the esophagus. The       maximum longitudinal extent of these mucosal changes was 2 cm in length.       Mucosa was biopsied with a cold forceps for histology.      A single 8 mm papule (nodule) with no bleeding and no stigmata of recent       bleeding was found in the gastric body. Biopsies were taken with a cold       forceps for histology.      Localized moderate inflammation characterized by erythema was found in       the gastric antrum. Biopsies were taken with a cold forceps for       histology.      The examined duodenum was normal. Impression:            - Esophageal mucosal changes consistent with                         short-segment Barrett's esophagus. Biopsied.                        - A single papule (nodule) found in the stomach.                         Biopsied.                        - Gastritis. Biopsied.                        - Normal examined duodenum. Recommendation:        - Discharge patient to home.                        - Resume previous diet.                        - Continue present medications.                        - Await pathology results.  Procedure Code(s):     --- Professional ---                        [id number], Esophagogastroduodenoscopy, flexible,                         transoral; with biopsy, single or multiple Diagnosis Code(s):     --- Professional ---                        K22.70, Barrett's esophagus without dysplasia                        K29.70, Gastritis, unspecified, without bleeding                        K31.89, Other diseases of stomach and duodenum CPT copyright 2019 American  Medical Association. All rights reserved. The codes documented in this report are preliminary and upon coder review may  be revised to meet current compliance requirements. Lucilla Lame MD, MD 07/18/2020 7:44:54 AM This report has been signed electronically. Number of Addenda: 0 Note Initiated On: 07/18/2020 7:21 AM Total Procedure Duration: 0 hours 5 minutes 33 seconds  Estimated Blood Loss:  Estimated blood loss: none.      [facility]

## 2020-07-18 NOTE — H&P (Addendum)
Derrick Lame, MD [address] [place], [place] [address] Phone:[phone] Fax : [phone]  Primary Care Physician:  Rusty Aus, MD Primary Gastroenterologist:  Dr. Allen Norris  Pre-Procedure History & Physical: HPI:  Derrick Obrien is a 59 y.o. male is here for an endoscopy and colonoscopy.   Past Medical History:  Diagnosis Date  . Atypical mole    L 5th finger   . Barrett esophagus   . Colon polyps   . Diabetes mellitus without complication ([place])   . Family history of breast cancer   . Family history of leukemia   . Family history of pancreatic cancer   . Family history of prostate cancer   . Hyperlipidemia   . Hypertension   . Personal history of colonic polyps   . Vertigo     Past Surgical History:  Procedure Laterality Date  . COLONOSCOPY    . COLONOSCOPY WITH PROPOFOL N/A 09/17/2016   Procedure: COLONOSCOPY WITH PROPOFOL;  Surgeon: Manya Silvas, MD;  Location: [facility] ENDOSCOPY;  Service: Endoscopy;  Laterality: N/A;  . ESOPHAGOGASTRODUODENOSCOPY    . ESOPHAGOGASTRODUODENOSCOPY (EGD) WITH PROPOFOL N/A 09/17/2016   Procedure: ESOPHAGOGASTRODUODENOSCOPY (EGD) WITH PROPOFOL;  Surgeon: Manya Silvas, MD;  Location: [facility] ENDOSCOPY;  Service: Endoscopy;  Laterality: N/A;  . VASECTOMY      Prior to Admission medications   Medication Sig Start Date End Date Taking? Authorizing Provider  aspirin EC 81 MG tablet Take 81 mg by mouth daily.   Yes [provider]  ezetimibe-simvastatin (VYTORIN) 10-40 MG tablet Take 1 tablet by mouth daily.   Yes [provider]  metFORMIN (GLUCOPHAGE) 500 MG tablet Take 500 mg by mouth daily. 10/20/17 07/09/20 Yes [provider]  Multiple Vitamin (MULTI-VITAMINS) TABS Take 1 tablet by mouth daily.   Yes [provider]  Niacin, Antihyperlipidemic, (NIASPAN PO) Take 500 mg by mouth daily.    Yes [provider]  omega-3 acid ethyl esters (LOVAZA) 1 g capsule Take 1 g by mouth  daily.    Yes [provider]  omeprazole (PRILOSEC) 40 MG capsule Take 40 mg by mouth 2 (two) times daily. 05/28/20  Yes [provider]  Telmisartan (MICARDIS PO) Take 1 tablet by mouth daily.    Yes [provider]    Allergies as of 06/06/2020  . (No Known Allergies)    Family History  Problem Relation Age of Onset  . Diabetes Maternal Grandfather   . Pancreatic cancer Maternal Grandfather 70  . Heart disease Father   . Prostate cancer Brother 10       metastatic  . Breast cancer Maternal Aunt        dx. early 63s  . Heart disease Paternal Aunt   . Acute lymphoblastic leukemia Niece 4  . Heart disease Cousin        paternal cousin, died in his 8s  . Heart attack Cousin 25       paternal cousin    Social History   Socioeconomic History  . Marital status: Married    Spouse name: Not on file  . Number of children: Not on file  . Years of education: Not on file  . Highest education level: Not on file  Occupational History  . Not on file  Tobacco Use  . Smoking status: Never Smoker  . Smokeless tobacco: Never Used  Vaping Use  . Vaping Use: Never used  Substance and Sexual Activity  . Alcohol use: No  Comment: maybe 1 drink/month  . Drug use: No  . Sexual activity: Not on file  Other Topics Concern  . Not on file  Social History Narrative  . Not on file   Social Determinants of Health   Financial Resource Strain:   . Difficulty of Paying Living Expenses: Not on file  Food Insecurity:   . Worried About Charity fundraiser in the Last Year: Not on file  . Ran Out of Food in the Last Year: Not on file  Transportation Needs:   . Lack of Transportation (Medical): Not on file  . Lack of Transportation (Non-Medical): Not on file  Physical Activity:   . Days of Exercise per Week: Not on file  . Minutes of Exercise per Session: Not on file  Stress:   . Feeling of Stress : Not on file  Social Connections:   . Frequency of  Communication with Friends and Family: Not on file  . Frequency of Social Gatherings with Friends and Family: Not on file  . Attends Religious Services: Not on file  . Active Member of Clubs or Organizations: Not on file  . Attends Archivist Meetings: Not on file  . Marital Status: Not on file  Intimate Partner Violence:   . Fear of Current or Ex-Partner: Not on file  . Emotionally Abused: Not on file  . Physically Abused: Not on file  . Sexually Abused: Not on file    Review of Systems: See HPI, otherwise negative ROS  Physical Exam: BP (!) 150/92   Pulse 74   Temp 97.6 F (36.4 C) (Temporal)   Resp 16   Ht 6' (1.829 m)   Wt 115.7 kg   SpO2 97%   BMI 34.58 kg/m  General:   Alert,  pleasant and cooperative in NAD Head:  Normocephalic and atraumatic. Neck:  Supple; no masses or thyromegaly. Lungs:  Clear throughout to auscultation.    Heart:  Regular rate and rhythm. Abdomen:  Soft, nontender and nondistended. Normal bowel sounds, without guarding, and without rebound.   Neurologic:  Alert and  oriented x4;  grossly normal neurologically.  Impression/Plan: Derrick Obrien is here for an endoscopy and colonoscopy to be performed for barrett's esophagus and history of colon polyps with adenomatous polpys. Lat colonoscopy 2018  Risks, benefits, limitations, and alternatives regarding  endoscopy and colonoscopy have been reviewed with the patient.  Questions have been answered.  All parties agreeable.   Derrick Lame, MD  07/18/2020, 7:17 AM

## 2020-07-18 NOTE — Transfer of Care (Signed)
Immediate Anesthesia Transfer of Care Note  Patient: Derrick Obrien  Procedure(s) Performed: COLONOSCOPY WITH BIOPSY (N/A Rectum) ESOPHAGOGASTRODUODENOSCOPY (EGD) WITH BIOPSY (N/A Mouth) POLYPECTOMY (N/A Rectum)  Patient Location: PACU  Anesthesia Type: General  Level of Consciousness: awake, alert  and patient cooperative  Airway and Oxygen Therapy: Patient Spontanous Breathing and Patient connected to supplemental oxygen  Post-op Assessment: Post-op Vital signs reviewed, Patient's Cardiovascular Status Stable, Respiratory Function Stable, Patent Airway and No signs of Nausea or vomiting  Post-op Vital Signs: Reviewed and stable  Complications: No complications documented.

## 2020-07-18 NOTE — Anesthesia Postprocedure Evaluation (Signed)
Anesthesia Post Note  Patient: Derrick Obrien  Procedure(s) Performed: COLONOSCOPY WITH BIOPSY (N/A Rectum) ESOPHAGOGASTRODUODENOSCOPY (EGD) WITH BIOPSY (N/A Mouth) POLYPECTOMY (N/A Rectum)     Patient location during evaluation: PACU Anesthesia Type: General Level of consciousness: awake Pain management: pain level controlled Vital Signs Assessment: post-procedure vital signs reviewed and stable Respiratory status: respiratory function stable Cardiovascular status: stable Postop Assessment: no signs of nausea or vomiting Anesthetic complications: no   No complications documented.  Veda Canning

## 2020-07-18 NOTE — Anesthesia Preprocedure Evaluation (Addendum)
Anesthesia Evaluation  Patient identified by MRN, date of birth, ID band Patient awake    Reviewed: Allergy & Precautions, NPO status   Airway Mallampati: II  TM Distance: >3 FB     Dental   Pulmonary    breath sounds clear to auscultation       Cardiovascular hypertension,  Rhythm:Regular Rate:Normal  HLD   Neuro/Psych    GI/Hepatic Barrett's esophagus   Endo/Other  diabetes, Type 2Obesity - BMI 35  Renal/GU      Musculoskeletal   Abdominal   Peds  Hematology   Anesthesia Other Findings   Reproductive/Obstetrics                             Anesthesia Physical Anesthesia Plan  ASA: II  Anesthesia Plan: General   Post-op Pain Management:    Induction: Intravenous  PONV Risk Score and Plan: Propofol infusion, TIVA and Treatment may vary due to age or medical condition  Airway Management Planned: Natural Airway and Nasal Cannula  Additional Equipment:   Intra-op Plan:   Post-operative Plan:   Informed Consent: I have reviewed the patients History and Physical, chart, labs and discussed the procedure including the risks, benefits and alternatives for the proposed anesthesia with the patient or authorized representative who has indicated his/her understanding and acceptance.       Plan Discussed with: CRNA  Anesthesia Plan Comments:         Anesthesia Quick Evaluation

## 2020-07-18 NOTE — Anesthesia Procedure Notes (Signed)
Date/Time: 07/18/2020 7:30 AM Performed by: Cameron Ali, CRNA Pre-anesthesia Checklist: Patient identified, Emergency Drugs available, Suction available, Timeout performed and Patient being monitored Patient Re-evaluated:Patient Re-evaluated prior to induction Oxygen Delivery Method: Nasal cannula Placement Confirmation: positive ETCO2

## 2020-07-18 NOTE — Op Note (Signed)
[facility] Gastroenterology Patient Name: Derrick Obrien Procedure Date: 07/18/2020 7:21 AM MRN: [id number] Account #: [id number] Date of Birth: 1961/03/03 Admit Type: Outpatient Age: 59 Room: [facility] OR ROOM 01 Gender: Male Note Status: Finalized Procedure:             Colonoscopy Indications:           High risk colon cancer surveillance: Personal history                         of colonic polyps Providers:             Lucilla Lame MD, MD Referring MD:          Rusty Aus, MD (Referring MD) Medicines:             Propofol per Anesthesia Complications:         No immediate complications. Procedure:             Pre-Anesthesia Assessment:                        - Prior to the procedure, a History and Physical was                         performed, and patient medications and allergies were                         reviewed. The patient's tolerance of previous                         anesthesia was also reviewed. The risks and benefits                         of the procedure and the sedation options and risks                         were discussed with the patient. All questions were                         answered, and informed consent was obtained. Prior                         Anticoagulants: The patient has taken no previous                         anticoagulant or antiplatelet agents. ASA Grade                         Assessment: II - A patient with mild systemic disease.                         After reviewing the risks and benefits, the patient                         was deemed in satisfactory condition to undergo the                         procedure.  After obtaining informed consent, the colonoscope was                         passed under direct vision. Throughout the procedure,                         the patient's blood pressure, pulse, and oxygen                         saturations were monitored continuously. The was                          introduced through the anus and advanced to the the                         cecum, identified by appendiceal orifice and ileocecal                         valve. The colonoscopy was performed without                         difficulty. The patient tolerated the procedure well.                         The quality of the bowel preparation was good. Findings:      The perianal and digital rectal examinations were normal.      A 3 mm polyp was found in the descending colon. The polyp was sessile.       The polyp was removed with a cold biopsy forceps. Resection and       retrieval were complete.      A single small-mouthed diverticulum was found in the ascending colon.      Non-bleeding internal hemorrhoids were found during retroflexion. The       hemorrhoids were Grade I (internal hemorrhoids that do not prolapse). Impression:            - One 3 mm polyp in the descending colon, removed with                         a cold biopsy forceps. Resected and retrieved.                        - Diverticulosis in the ascending colon.                        - Non-bleeding internal hemorrhoids. Recommendation:        - Discharge patient to home.                        - Resume previous diet.                        - Continue present medications.                        - Await pathology results. Procedure Code(s):     --- Professional ---                        [phone], Colonoscopy, flexible; with  biopsy, single or                         multiple Diagnosis Code(s):     --- Professional ---                        Z86.010, Personal history of colonic polyps                        K63.5, Polyp of colon CPT copyright 2019 American Medical Association. All rights reserved. The codes documented in this report are preliminary and upon coder review may  be revised to meet current compliance requirements. Lucilla Lame MD, MD 07/18/2020 7:59:36 AM This report has been signed electronically. Number  of Addenda: 0 Note Initiated On: 07/18/2020 7:21 AM Scope Withdrawal Time: 0 hours 7 minutes 30 seconds  Total Procedure Duration: 0 hours 10 minutes 54 seconds  Estimated Blood Loss:  Estimated blood loss: none.      [facility]

## 2020-07-21 ENCOUNTER — Encounter: Payer: Self-pay | Admitting: Gastroenterology

## 2020-07-22 LAB — SURGICAL PATHOLOGY

## 2020-07-24 ENCOUNTER — Encounter: Payer: Self-pay | Admitting: Gastroenterology

## 2021-05-14 ENCOUNTER — Ambulatory Visit (INDEPENDENT_AMBULATORY_CARE_PROVIDER_SITE_OTHER): Payer: 59 | Admitting: Dermatology

## 2021-05-14 ENCOUNTER — Other Ambulatory Visit: Payer: Self-pay

## 2021-05-14 DIAGNOSIS — L820 Inflamed seborrheic keratosis: Secondary | ICD-10-CM

## 2021-05-14 DIAGNOSIS — Z86018 Personal history of other benign neoplasm: Secondary | ICD-10-CM | POA: Diagnosis not present

## 2021-05-14 DIAGNOSIS — D1800 Hemangioma unspecified site: Secondary | ICD-10-CM

## 2021-05-14 DIAGNOSIS — L578 Other skin changes due to chronic exposure to nonionizing radiation: Secondary | ICD-10-CM

## 2021-05-14 DIAGNOSIS — Z1283 Encounter for screening for malignant neoplasm of skin: Secondary | ICD-10-CM

## 2021-05-14 DIAGNOSIS — D229 Melanocytic nevi, unspecified: Secondary | ICD-10-CM | POA: Diagnosis not present

## 2021-05-14 DIAGNOSIS — L814 Other melanin hyperpigmentation: Secondary | ICD-10-CM

## 2021-05-14 DIAGNOSIS — L918 Other hypertrophic disorders of the skin: Secondary | ICD-10-CM

## 2021-05-14 DIAGNOSIS — L821 Other seborrheic keratosis: Secondary | ICD-10-CM

## 2021-05-14 NOTE — Progress Notes (Signed)
Follow-Up Visit   Subjective  Derrick Obrien is a 60 y.o. male who presents for the following: Annual Exam (Patient here today for annual tbse. He reports no new concerns. ). Hx of dysplastic nevus L 5th finger tip.  Other growths to be checked today. Patient here for full body skin exam and skin cancer screening.  The following portions of the chart were reviewed this encounter and updated as appropriate:  Tobacco  Allergies  Meds  Problems  Med Hx  Surg Hx  Fam Hx     Review of Systems: No other skin or systemic complaints except as noted in HPI or Assessment and Plan.  Objective  Well appearing patient in no apparent distress; mood and affect are within normal limits.  A full examination was performed including scalp, head, eyes, ears, nose, lips, neck, chest, axillae, abdomen, back, buttocks, bilateral upper extremities, bilateral lower extremities, hands, feet, fingers, toes, fingernails, and toenails. All findings within normal limits unless otherwise noted below.  Assessment & Plan  Inflamed seborrheic keratosis scalp x 3 and temples x 4  Destruction of lesion - scalp x 3 and temples x 4 Complexity: simple   Destruction method: cryotherapy   Informed consent: discussed and consent obtained   Timeout:  patient name, date of birth, surgical site, and procedure verified Lesion destroyed using liquid nitrogen: Yes   Region frozen until ice ball extended beyond lesion: Yes   Outcome: patient tolerated procedure well with no complications   Post-procedure details: wound care instructions given    Lentigines - Scattered tan macules - Due to sun exposure - Benign-appearing, observe - Recommend daily broad spectrum sunscreen SPF 30+ to sun-exposed areas, reapply every 2 hours as needed. - Call for any changes  Seborrheic Keratoses  - Stuck-on, waxy, tan-brown papules and/or plaques  - Benign-appearing - Discussed benign etiology and prognosis. - Observe - Call for  any changes  Acrochordons (Skin Tags) - Fleshy, skin-colored pedunculated papules skin tags right side x 5 isk scalp - Benign appearing.  - Observe. - If desired, they can be removed with an in office procedure that is not covered by insurance. - Please call the clinic if you notice any new or changing lesions.  Melanocytic Nevi - Tan-brown and/or pink-flesh-colored symmetric macules and papules - Benign appearing on exam today - Observation - Call clinic for new or changing moles - Recommend daily use of broad spectrum spf 30+ sunscreen to sun-exposed areas.   Hemangiomas - Red papules - Discussed benign nature - Observe - Call for any changes  Actinic Damage - Chronic condition, secondary to cumulative UV/sun exposure - diffuse scaly erythematous macules with underlying dyspigmentation - Recommend daily broad spectrum sunscreen SPF 30+ to sun-exposed areas, reapply every 2 hours as needed.  - Staying in the shade or wearing long sleeves, sun glasses (UVA+UVB protection) and wide brim hats (4-inch brim around the entire circumference of the hat) are also recommended for sun protection.  - Call for new or changing lesions.  History of Dysplastic Nevus - Left 5th finger tip - No evidence of recurrence today left 5th finger  - Recommend regular full body skin exams - Recommend daily broad spectrum sunscreen SPF 30+ to sun-exposed areas, reapply every 2 hours as needed.  - Call if any new or changing lesions are noted between office visits  Previous pathology history: Patient prior pathology history all benign.  - 05/09/2007 biopsy proven fibrous papule at Nasal tip, 0.25 cm  - 05/09/2007  biopsy proven verruca vulgaris irritated, Crown Scalp  - 05/10/2013 biopsy proven melanocytic nevus, left inferior scapula - 05/10/2013 biopsy proven seborrheic keratosis with features of verruca vulgaris at crown scalp - 05/22/2015 biopsy proven melanocytic nevus intradermal type , irritated at  right posterior waistline.   Skin cancer screening performed today.  Return for 1 year tbse . Derrick Obrien, CMA, am acting as scribe for Sarina Ser, MD. Documentation: I have reviewed the above documentation for accuracy and completeness, and I agree with the above.  Sarina Ser, MD

## 2021-05-14 NOTE — Patient Instructions (Addendum)
Cryotherapy Aftercare  Wash gently with soap and water everyday.   Apply Vaseline and Band-Aid daily until healed.   Seborrheic Keratosis  What causes seborrheic keratoses? Seborrheic keratoses are harmless, common skin growths that first appear during adult life.  As time goes by, more growths appear.  Some people may develop a large number of them.  Seborrheic keratoses appear on both covered and uncovered body parts.  They are not caused by sunlight.  The tendency to develop seborrheic keratoses can be inherited.  They vary in color from skin-colored to gray, brown, or even black.  They can be either smooth or have a rough, warty surface.   Seborrheic keratoses are superficial and look as if they were stuck on the skin.  Under the microscope this type of keratosis looks like layers upon layers of skin.  That is why at times the top layer may seem to fall off, but the rest of the growth remains and re-grows.    Treatment Seborrheic keratoses do not need to be treated, but can easily be removed in the office.  Seborrheic keratoses often cause symptoms when they rub on clothing or jewelry.  Lesions can be in the way of shaving.  If they become inflamed, they can cause itching, soreness, or burning.  Removal of a seborrheic keratosis can be accomplished by freezing, burning, or surgery. If any spot bleeds, scabs, or grows rapidly, please return to have it checked, as these can be an indication of a skin cancer.  Melanoma ABCDEs  Melanoma is the most dangerous type of skin cancer, and is the leading cause of death from skin disease.  You are more likely to develop melanoma if you: Have light-colored skin, light-colored eyes, or red or blond hair Spend a lot of time in the sun Tan regularly, either outdoors or in a tanning bed Have had blistering sunburns, especially during childhood Have a close family member who has had a melanoma Have atypical moles or large birthmarks  Early detection of  melanoma is key since treatment is typically straightforward and cure rates are extremely high if we catch it early.   The first sign of melanoma is often a change in a mole or a new dark spot.  The ABCDE system is a way of remembering the signs of melanoma.  A for asymmetry:  The two halves do not match. B for border:  The edges of the growth are irregular. C for color:  A mixture of colors are present instead of an even brown color. D for diameter:  Melanomas are usually (but not always) greater than 6mm - the size of a pencil eraser. E for evolution:  The spot keeps changing in size, shape, and color.  Please check your skin once per month between visits. You can use a small mirror in front and a large mirror behind you to keep an eye on the back side or your body.   If you see any new or changing lesions before your next follow-up, please call to schedule a visit.  Please continue daily skin protection including broad spectrum sunscreen SPF 30+ to sun-exposed areas, reapplying every 2 hours as needed when you're outdoors.   Staying in the shade or wearing long sleeves, sun glasses (UVA+UVB protection) and wide brim hats (4-inch brim around the entire circumference of the hat) are also recommended for sun protection.    If you have any questions or concerns for your doctor, please call our main line at [phone]   and press option 4 to reach your doctor's medical assistant. If no one answers, please leave a voicemail as directed and we will return your call as soon as possible. Messages left after 4 pm will be answered the following business day.   You may also send us a message via MyChart. We typically respond to MyChart messages within 1-2 business days.  For prescription refills, please ask your pharmacy to contact our office. Our fax number is [phone].  If you have an urgent issue when the clinic is closed that cannot wait until the next business day, you can page your doctor at  the number below.    Please note that while we do our best to be available for urgent issues outside of office hours, we are not available 24/7.   If you have an urgent issue and are unable to reach us, you may choose to seek medical care at your doctor's office, retail clinic, urgent care center, or emergency room.  If you have a medical emergency, please immediately call 911 or go to the emergency department.  Pager Numbers  - Dr. Kowalski: [phone]  - Dr. Moye: [phone]  - Dr. Stewart: [phone]  In the event of inclement weather, please call our main line at [phone] for an update on the status of any delays or closures.  Dermatology Medication Tips: Please keep the boxes that topical medications come in in order to help keep track of the instructions about where and how to use these. Pharmacies typically print the medication instructions only on the boxes and not directly on the medication tubes.   If your medication is too expensive, please contact our office at [phone] option 4 or send us a message through MyChart.   We are unable to tell what your co-pay for medications will be in advance as this is different depending on your insurance coverage. However, we may be able to find a substitute medication at lower cost or fill out paperwork to get insurance to cover a needed medication.   If a prior authorization is required to get your medication covered by your insurance company, please allow us 1-2 business days to complete this process.  Drug prices often vary depending on where the prescription is filled and some pharmacies may offer cheaper prices.  The website www.goodrx.com contains coupons for medications through different pharmacies. The prices here do not account for what the cost may be with help from insurance (it may be cheaper with your insurance), but the website can give you the price if you did not use any insurance.  - You can print the  associated coupon and take it with your prescription to the pharmacy.  - You may also stop by our office during regular business hours and pick up a GoodRx coupon card.  - If you need your prescription sent electronically to a different pharmacy, notify our office through  MyChart or by phone at [phone] option 4.  

## 2021-05-15 ENCOUNTER — Encounter: Payer: Self-pay | Admitting: Dermatology

## 2022-05-20 ENCOUNTER — Ambulatory Visit: Payer: 59 | Admitting: Dermatology

## 2023-10-18 ENCOUNTER — Encounter: Payer: Self-pay | Admitting: Dermatology

## 2023-10-18 ENCOUNTER — Ambulatory Visit (INDEPENDENT_AMBULATORY_CARE_PROVIDER_SITE_OTHER): Payer: 59 | Admitting: Dermatology

## 2023-10-18 DIAGNOSIS — Z1283 Encounter for screening for malignant neoplasm of skin: Secondary | ICD-10-CM

## 2023-10-18 DIAGNOSIS — L814 Other melanin hyperpigmentation: Secondary | ICD-10-CM | POA: Diagnosis not present

## 2023-10-18 DIAGNOSIS — D2272 Melanocytic nevi of left lower limb, including hip: Secondary | ICD-10-CM

## 2023-10-18 DIAGNOSIS — Z86018 Personal history of other benign neoplasm: Secondary | ICD-10-CM

## 2023-10-18 DIAGNOSIS — L918 Other hypertrophic disorders of the skin: Secondary | ICD-10-CM

## 2023-10-18 DIAGNOSIS — Z872 Personal history of diseases of the skin and subcutaneous tissue: Secondary | ICD-10-CM

## 2023-10-18 DIAGNOSIS — L570 Actinic keratosis: Secondary | ICD-10-CM | POA: Diagnosis not present

## 2023-10-18 DIAGNOSIS — L578 Other skin changes due to chronic exposure to nonionizing radiation: Secondary | ICD-10-CM | POA: Diagnosis not present

## 2023-10-18 DIAGNOSIS — W908XXA Exposure to other nonionizing radiation, initial encounter: Secondary | ICD-10-CM

## 2023-10-18 DIAGNOSIS — D229 Melanocytic nevi, unspecified: Secondary | ICD-10-CM

## 2023-10-18 DIAGNOSIS — L821 Other seborrheic keratosis: Secondary | ICD-10-CM

## 2023-10-18 DIAGNOSIS — D1801 Hemangioma of skin and subcutaneous tissue: Secondary | ICD-10-CM

## 2023-10-18 NOTE — Progress Notes (Signed)
 Follow-Up Visit   Subjective  Derrick Obrien is a 63 y.o. male who presents for the following: Skin Cancer Screening and Full Body Skin Exam, hx of Dysplastic Nevus, hx of AKs The patient presents for Total-Body Skin Exam (TBSE) for skin cancer screening and mole check. The patient has spots, moles and lesions to be evaluated, some may be new or changing and the patient may have concern these could be cancer.  The following portions of the chart were reviewed this encounter and updated as appropriate: medications, allergies, medical history  Review of Systems:  No other skin or systemic complaints except as noted in HPI or Assessment and Plan.  Objective  Well appearing patient in no apparent distress; mood and affect are within normal limits.  A full examination was performed including scalp, head, eyes, ears, nose, lips, neck, chest, axillae, abdomen, back, buttocks, bilateral upper extremities, bilateral lower extremities, hands, feet, fingers, toes, fingernails, and toenails. All findings within normal limits unless otherwise noted below.   Relevant physical exam findings are noted in the Assessment and Plan.  L plantar foot     Scalp x 1 Pink scaly macules  Assessment & Plan   SKIN CANCER SCREENING PERFORMED TODAY.  ACTINIC DAMAGE - Chronic condition, secondary to cumulative UV/sun exposure - diffuse scaly erythematous macules with underlying dyspigmentation - Recommend daily broad spectrum sunscreen SPF 30+ to sun-exposed areas, reapply every 2 hours as needed.  - Staying in the shade or wearing long sleeves, sun glasses (UVA+UVB protection) and wide brim hats (4-inch brim around the entire circumference of the hat) are also recommended for sun protection.  - Call for new or changing lesions.  LENTIGINES, SEBORRHEIC KERATOSES, HEMANGIOMAS - Benign normal skin lesions - Benign-appearing - Call for any changes  MELANOCYTIC NEVI - Tan-brown and/or pink-flesh-colored  symmetric macules and papules - Benign appearing on exam today - Observation - Call clinic for new or changing moles - Recommend daily use of broad spectrum spf 30+ sunscreen to sun-exposed areas.  - L plantar foot regular brown macules, see photo  HISTORY OF DYSPLASTIC NEVUS No evidence of recurrence today Recommend regular full body skin exams Recommend daily broad spectrum sunscreen SPF 30+ to sun-exposed areas, reapply every 2 hours as needed.  Call if any new or changing lesions are noted between office visits  - L 5th finger  Acrochordons (Skin Tags) axilla - Fleshy, skin-colored pedunculated papules - Benign appearing.  - Observe. - If desired, they can be removed with an in office procedure that is not covered by insurance. - Please call the clinic if you notice any new or changing lesions.   HEMANGIOMA L post shoulder Exam: red macule, c/w hemangioma with dermoscopy Treatment Plan: Benign-appearing.  Observation.  Call clinic for new or changing moles.  Recommend daily use of broad spectrum spf 30+ sunscreen to sun-exposed areas.    AK (ACTINIC KERATOSIS) Scalp x 1 Actinic keratoses are precancerous spots that appear secondary to cumulative UV radiation exposure/sun exposure over time. They are chronic with expected duration over 1 year. A portion of actinic keratoses will progress to squamous cell carcinoma of the skin. It is not possible to reliably predict which spots will progress to skin cancer and so treatment is recommended to prevent development of skin cancer.  Recommend daily broad spectrum sunscreen SPF 30+ to sun-exposed areas, reapply every 2 hours as needed.  Recommend staying in the shade or wearing long sleeves, sun glasses (UVA+UVB protection) and wide brim hats (  4-inch brim around the entire circumference of the hat). Call for new or changing lesions. Destruction of lesion - Scalp x 1 Complexity: simple   Destruction method: cryotherapy   Informed  consent: discussed and consent obtained   Timeout:  patient name, date of birth, surgical site, and procedure verified Lesion destroyed using liquid nitrogen: Yes   Region frozen until ice ball extended beyond lesion: Yes   Outcome: patient tolerated procedure well with no complications   Post-procedure details: wound care instructions given   Return in about 1 year (around 10/17/2024) for TBSE, Hx of Dysplastic nevi, Hx of AKs.  I, Ardis Rowan, RMA, am acting as scribe for Armida Sans, MD .   Documentation: I have reviewed the above documentation for accuracy and completeness, and I agree with the above.  Armida Sans, MD

## 2023-10-18 NOTE — Patient Instructions (Addendum)

## 2024-03-12 ENCOUNTER — Other Ambulatory Visit: Payer: Self-pay | Admitting: Internal Medicine

## 2024-03-12 DIAGNOSIS — E119 Type 2 diabetes mellitus without complications: Secondary | ICD-10-CM

## 2024-03-12 DIAGNOSIS — E782 Mixed hyperlipidemia: Secondary | ICD-10-CM

## 2024-03-29 ENCOUNTER — Ambulatory Visit
Admission: RE | Admit: 2024-03-29 | Discharge: 2024-03-29 | Disposition: A | Discharge status: Home / Self Care | Payer: Self-pay | Source: Ambulatory Visit | Attending: Internal Medicine | Admitting: Internal Medicine

## 2024-03-29 DIAGNOSIS — E119 Type 2 diabetes mellitus without complications: Secondary | ICD-10-CM | POA: Insufficient documentation

## 2024-03-29 DIAGNOSIS — E782 Mixed hyperlipidemia: Secondary | ICD-10-CM | POA: Insufficient documentation

## 2024-04-11 ENCOUNTER — Other Ambulatory Visit: Payer: Self-pay | Admitting: Cardiology

## 2024-04-11 DIAGNOSIS — Z8249 Family history of ischemic heart disease and other diseases of the circulatory system: Secondary | ICD-10-CM

## 2024-04-11 DIAGNOSIS — R931 Abnormal findings on diagnostic imaging of heart and coronary circulation: Secondary | ICD-10-CM

## 2024-04-11 DIAGNOSIS — Z7689 Persons encountering health services in other specified circumstances: Secondary | ICD-10-CM

## 2024-04-20 ENCOUNTER — Ambulatory Visit
Admission: RE | Admit: 2024-04-20 | Discharge: 2024-04-20 | Disposition: A | Discharge status: Home / Self Care | Source: Ambulatory Visit | Attending: Cardiology | Admitting: Cardiology

## 2024-04-20 DIAGNOSIS — Z8249 Family history of ischemic heart disease and other diseases of the circulatory system: Secondary | ICD-10-CM | POA: Diagnosis present

## 2024-04-20 DIAGNOSIS — R931 Abnormal findings on diagnostic imaging of heart and coronary circulation: Secondary | ICD-10-CM | POA: Insufficient documentation

## 2024-04-20 DIAGNOSIS — Z7689 Persons encountering health services in other specified circumstances: Secondary | ICD-10-CM | POA: Insufficient documentation

## 2024-04-20 LAB — POCT I-STAT CREATININE: Creatinine, Ser: 1.2 mg/dL (ref 0.61–1.24)

## 2024-04-20 MED ORDER — IOHEXOL 350 MG/ML SOLN
75.0000 mL | Freq: Once | INTRAVENOUS | Status: AC | PRN
  Administered 2024-04-20: 75 mL via INTRAVENOUS

## 2024-04-23 ENCOUNTER — Other Ambulatory Visit: Payer: Self-pay | Admitting: Cardiology

## 2024-04-23 DIAGNOSIS — Z8249 Family history of ischemic heart disease and other diseases of the circulatory system: Secondary | ICD-10-CM

## 2024-04-23 DIAGNOSIS — R931 Abnormal findings on diagnostic imaging of heart and coronary circulation: Secondary | ICD-10-CM

## 2024-05-16 ENCOUNTER — Telehealth (HOSPITAL_COMMUNITY): Payer: Self-pay | Admitting: *Deleted

## 2024-05-16 NOTE — Telephone Encounter (Signed)
 Reaching out to patient to offer assistance regarding upcoming cardiac imaging study; pt verbalizes understanding of appt date/time, parking situation and where to check in, pre-test NPO status and medications ordered, and verified current allergies; name and call back number provided for further questions should they arise  Chantal Requena RN Navigator Cardiac Imaging [facility] [phone] office [phone] cell

## 2024-05-17 ENCOUNTER — Ambulatory Visit
Admission: RE | Admit: 2024-05-17 | Discharge: 2024-05-17 | Disposition: A | Discharge status: Home / Self Care | Source: Ambulatory Visit | Attending: Cardiology | Admitting: Cardiology

## 2024-05-17 DIAGNOSIS — Z8249 Family history of ischemic heart disease and other diseases of the circulatory system: Secondary | ICD-10-CM | POA: Diagnosis not present

## 2024-05-17 DIAGNOSIS — R931 Abnormal findings on diagnostic imaging of heart and coronary circulation: Secondary | ICD-10-CM | POA: Diagnosis present

## 2024-05-17 DIAGNOSIS — R079 Chest pain, unspecified: Secondary | ICD-10-CM | POA: Diagnosis not present

## 2024-05-17 MED ORDER — NITROGLYCERIN 0.4 MG SL SUBL
0.8000 mg | SUBLINGUAL_TABLET | Freq: Once | SUBLINGUAL | Status: AC
  Administered 2024-05-17: 0.8 mg via SUBLINGUAL
  Filled 2024-05-17: qty 25

## 2024-05-17 MED ORDER — IOHEXOL 350 MG/ML SOLN
100.0000 mL | Freq: Once | INTRAVENOUS | Status: AC | PRN
  Administered 2024-05-17: 100 mL via INTRAVENOUS

## 2024-05-17 NOTE — Progress Notes (Signed)
 Patient tolerated procedure well. W/C to lobby.  Ambulate w/o difficulty. Denies light headedness or being dizzy. Encouraged to drink extra water today and reasoning explained. Verbalized understanding. All questions answered. ABC intact. No further needs. Discharge from procedure area w/o issues.

## 2024-08-03 ENCOUNTER — Ambulatory Visit: Admitting: Anesthesiology

## 2024-08-03 ENCOUNTER — Encounter: Admission: RE | Disposition: A | Payer: Self-pay | Attending: Gastroenterology

## 2024-08-03 ENCOUNTER — Ambulatory Visit
Admission: RE | Admit: 2024-08-03 | Discharge: 2024-08-03 | Disposition: A | Discharge status: Home / Self Care | Attending: Gastroenterology | Admitting: Gastroenterology

## 2024-08-03 ENCOUNTER — Encounter: Payer: Self-pay | Admitting: Gastroenterology

## 2024-08-03 DIAGNOSIS — E119 Type 2 diabetes mellitus without complications: Secondary | ICD-10-CM | POA: Diagnosis not present

## 2024-08-03 DIAGNOSIS — K219 Gastro-esophageal reflux disease without esophagitis: Secondary | ICD-10-CM | POA: Insufficient documentation

## 2024-08-03 DIAGNOSIS — E785 Hyperlipidemia, unspecified: Secondary | ICD-10-CM | POA: Diagnosis not present

## 2024-08-03 DIAGNOSIS — K2970 Gastritis, unspecified, without bleeding: Secondary | ICD-10-CM | POA: Insufficient documentation

## 2024-08-03 DIAGNOSIS — K209 Esophagitis, unspecified without bleeding: Secondary | ICD-10-CM | POA: Insufficient documentation

## 2024-08-03 DIAGNOSIS — Z8719 Personal history of other diseases of the digestive system: Secondary | ICD-10-CM | POA: Insufficient documentation

## 2024-08-03 DIAGNOSIS — Z860101 Personal history of adenomatous and serrated colon polyps: Secondary | ICD-10-CM | POA: Diagnosis not present

## 2024-08-03 DIAGNOSIS — I10 Essential (primary) hypertension: Secondary | ICD-10-CM | POA: Insufficient documentation

## 2024-08-03 DIAGNOSIS — K2270 Barrett's esophagus without dysplasia: Secondary | ICD-10-CM | POA: Diagnosis present

## 2024-08-03 DIAGNOSIS — Z1211 Encounter for screening for malignant neoplasm of colon: Secondary | ICD-10-CM | POA: Diagnosis not present

## 2024-08-03 DIAGNOSIS — Z7984 Long term (current) use of oral hypoglycemic drugs: Secondary | ICD-10-CM | POA: Insufficient documentation

## 2024-08-03 DIAGNOSIS — K640 First degree hemorrhoids: Secondary | ICD-10-CM | POA: Insufficient documentation

## 2024-08-03 HISTORY — PX: BIOPSY OF SKIN SUBCUTANEOUS TISSUE AND/OR MUCOUS MEMBRANE: SHX6741

## 2024-08-03 HISTORY — PX: ESOPHAGOGASTRODUODENOSCOPY: SHX5428

## 2024-08-03 HISTORY — PX: COLONOSCOPY: SHX5424

## 2024-08-03 LAB — GLUCOSE, CAPILLARY: Glucose-Capillary: 122 mg/dL — ABNORMAL HIGH (ref 70–99)

## 2024-08-03 SURGERY — COLONOSCOPY
Anesthesia: General

## 2024-08-03 MED ORDER — EPHEDRINE SULFATE-NACL 50-0.9 MG/10ML-% IV SOSY
PREFILLED_SYRINGE | INTRAVENOUS | Status: DC | PRN
  Administered 2024-08-03: 10 mg via INTRAVENOUS

## 2024-08-03 MED ORDER — PROPOFOL 10 MG/ML IV BOLUS
INTRAVENOUS | Status: AC
  Filled 2024-08-03: qty 40

## 2024-08-03 MED ORDER — LIDOCAINE HCL (CARDIAC) PF 100 MG/5ML IV SOSY
PREFILLED_SYRINGE | INTRAVENOUS | Status: DC | PRN
  Administered 2024-08-03: 60 mg via INTRAVENOUS

## 2024-08-03 MED ORDER — DEXMEDETOMIDINE HCL IN NACL 200 MCG/50ML IV SOLN
INTRAVENOUS | Status: DC | PRN
  Administered 2024-08-03 (×2): 4 ug via INTRAVENOUS

## 2024-08-03 MED ORDER — DEXMEDETOMIDINE HCL IN NACL 80 MCG/20ML IV SOLN
INTRAVENOUS | Status: AC
  Filled 2024-08-03: qty 20

## 2024-08-03 MED ORDER — PROPOFOL 10 MG/ML IV BOLUS
INTRAVENOUS | Status: DC | PRN
  Administered 2024-08-03 (×2): 20 mg via INTRAVENOUS
  Administered 2024-08-03: 30 mg via INTRAVENOUS
  Administered 2024-08-03: 20 mg via INTRAVENOUS
  Administered 2024-08-03: 40 mg via INTRAVENOUS
  Administered 2024-08-03: 50 mg via INTRAVENOUS
  Administered 2024-08-03: 30 mg via INTRAVENOUS
  Administered 2024-08-03: 20 mg via INTRAVENOUS
  Administered 2024-08-03: 30 mg via INTRAVENOUS
  Administered 2024-08-03: 10 mg via INTRAVENOUS

## 2024-08-03 MED ORDER — SODIUM CHLORIDE 0.9 % IV SOLN
INTRAVENOUS | Status: DC
  Administered 2024-08-03: 20 mL/h via INTRAVENOUS

## 2024-08-03 MED ORDER — LIDOCAINE HCL (PF) 2 % IJ SOLN
INTRAMUSCULAR | Status: AC
  Filled 2024-08-03: qty 5

## 2024-08-03 NOTE — Op Note (Signed)
 [facility] Patient Name: Derrick Obrien Procedure Date: 08/03/2024 12:24 PM MRN: [id number] Account #: [id number] Date of Birth: Apr 29, 1961 Admit Type: Outpatient Age: 63 Room: [facility] ENDO ROOM 3 Gender: Male Note Status: Finalized Instrument Name: Barnie GI Scope [id number] Procedure:             Upper GI endoscopy Indications:           Barrett's esophagus Providers:             Ole Schick MD, MD Medicines:             Monitored Anesthesia Care Complications:         No immediate complications. Estimated blood loss:                         Minimal. Procedure:             Pre-Anesthesia Assessment:                        - Prior to the procedure, a History and Physical was                         performed, and patient medications and allergies were                         reviewed. The patient is competent. The risks and                         benefits of the procedure and the sedation options and                         risks were discussed with the patient. All questions                         were answered and informed consent was obtained.                         Patient identification and proposed procedure were                         verified by the physician, the nurse, the                         anesthesiologist, the anesthetist and the technician                         in the endoscopy suite. Mental Status Examination:                         alert and oriented. Airway Examination: normal                         oropharyngeal airway and neck mobility. Respiratory                         Examination: clear to auscultation. CV Examination:                         normal. Prophylactic Antibiotics: The patient does not  require prophylactic antibiotics. Prior                         Anticoagulants: The patient has taken no anticoagulant                         or antiplatelet agents. ASA Grade Assessment: III - A                          patient with severe systemic disease. After reviewing                         the risks and benefits, the patient was deemed in                         satisfactory condition to undergo the procedure. The                         anesthesia plan was to use monitored anesthesia care                         (MAC). Immediately prior to administration of                         medications, the patient was re-assessed for adequacy                         to receive sedatives. The heart rate, respiratory                         rate, oxygen saturations, blood pressure, adequacy of                         pulmonary ventilation, and response to care were                         monitored throughout the procedure. The physical                         status of the patient was re-assessed after the                         procedure.                        After obtaining informed consent, the endoscope was                         passed under direct vision. Throughout the procedure,                         the patient's blood pressure, pulse, and oxygen                         saturations were monitored continuously. The Endoscope                         was introduced through the mouth, and advanced to the  second part of duodenum. The upper GI endoscopy was                         accomplished without difficulty. The patient tolerated                         the procedure well. Findings:      One tongue of salmon-colored mucosa was present. No other visible       abnormalities were present. The maximum longitudinal extent of these       esophageal mucosal changes was 1 cm in length. Biopsies were taken with       a cold forceps for histology. Estimated blood loss was minimal.      Patchy mild inflammation characterized by erythema was found in the       gastric antrum. Biopsies were taken with a cold forceps for Helicobacter       pylori testing.  Estimated blood loss was minimal.      The examined duodenum was normal. Impression:            - Salmon-colored mucosa classified as Barrett's stage                         C0-M1 per Prague criteria. Biopsied.                        - Gastritis. Biopsied.                        - Normal examined duodenum. Recommendation:        - Discharge patient to home.                        - Resume previous diet.                        - Continue present medications.                        - Await pathology results.                        - Return to referring physician as previously                         scheduled. Procedure Code(s):     --- Professional ---                        [phone], Esophagogastroduodenoscopy, flexible,                         transoral; with biopsy, single or multiple Diagnosis Code(s):     --- Professional ---                        K22.70, Barrett's esophagus without dysplasia                        K29.70, Gastritis, unspecified, without bleeding CPT copyright 2022 [employer] Association. All rights reserved. The codes documented in this report are preliminary and upon coder review may  be revised to meet current compliance requirements. Ole Schick MD,  MD 08/03/2024 1:03:10 PM Number of Addenda: 0 Note Initiated On: 08/03/2024 12:24 PM Estimated Blood Loss:  Estimated blood loss was minimal.      [facility]

## 2024-08-03 NOTE — Anesthesia Postprocedure Evaluation (Signed)
"   Anesthesia Post Note  Patient: Derrick Obrien  Procedure(s) Performed: COLONOSCOPY EGD (ESOPHAGOGASTRODUODENOSCOPY)  Patient location during evaluation: Endoscopy Anesthesia Type: General Level of consciousness: awake and alert Pain management: pain level controlled Vital Signs Assessment: post-procedure vital signs reviewed and stable Respiratory status: spontaneous breathing, nonlabored ventilation and respiratory function stable Cardiovascular status: blood pressure returned to baseline and stable Postop Assessment: no apparent nausea or vomiting Anesthetic complications: no   No notable events documented.   Last Vitals:  Vitals:   08/03/24 1314 08/03/24 1324  BP: 114/64 117/77  Pulse: 64 64  Resp: 16 17  Temp:    SpO2: 99% 99%    Last Pain:  Vitals:   08/03/24 1324  TempSrc:   PainSc: 0-No pain                 Fairy POUR Georgi Tuel      "

## 2024-08-03 NOTE — Op Note (Signed)
 [facility] Patient Name: Derrick Obrien Procedure Date: 08/03/2024 12:23 PM MRN: [id number] Account #: [id number] Date of Birth: 1961-04-04 Admit Type: Outpatient Age: 63 Room: [facility] ENDO ROOM 3 Gender: Male Note Status: Finalized Instrument Name: Colon Scope [id number] Procedure:             Colonoscopy Indications:           Surveillance: Personal history of adenomatous polyps                         on last colonoscopy > 3 years ago Providers:             Ole Schick MD, MD Medicines:             Monitored Anesthesia Care Complications:         No immediate complications. Procedure:             Pre-Anesthesia Assessment:                        - Prior to the procedure, a History and Physical was                         performed, and patient medications and allergies were                         reviewed. The patient is competent. The risks and                         benefits of the procedure and the sedation options and                         risks were discussed with the patient. All questions                         were answered and informed consent was obtained.                         Patient identification and proposed procedure were                         verified by the physician, the nurse, the                         anesthesiologist, the anesthetist and the technician                         in the endoscopy suite. Mental Status Examination:                         alert and oriented. Airway Examination: normal                         oropharyngeal airway and neck mobility. Respiratory                         Examination: clear to auscultation. CV Examination:                         normal. Prophylactic Antibiotics: The patient does not  require prophylactic antibiotics. Prior                         Anticoagulants: The patient has taken no anticoagulant                         or antiplatelet agents. ASA  Grade Assessment: III - A                         patient with severe systemic disease. After reviewing                         the risks and benefits, the patient was deemed in                         satisfactory condition to undergo the procedure. The                         anesthesia plan was to use monitored anesthesia care                         (MAC). Immediately prior to administration of                         medications, the patient was re-assessed for adequacy                         to receive sedatives. The heart rate, respiratory                         rate, oxygen saturations, blood pressure, adequacy of                         pulmonary ventilation, and response to care were                         monitored throughout the procedure. The physical                         status of the patient was re-assessed after the                         procedure.                        After obtaining informed consent, the colonoscope was                         passed under direct vision. Throughout the procedure,                         the patient's blood pressure, pulse, and oxygen                         saturations were monitored continuously. The                         Colonoscope was introduced through the anus and  advanced to the the terminal ileum, with                         identification of the appendiceal orifice and IC                         valve. The colonoscopy was performed without                         difficulty. The patient tolerated the procedure well.                         The quality of the bowel preparation was good. The                         terminal ileum, ileocecal valve, appendiceal orifice,                         and rectum were photographed. Findings:      The perianal and digital rectal examinations were normal.      The terminal ileum appeared normal.      Internal hemorrhoids were found during retroflexion. The  hemorrhoids       were Grade I (internal hemorrhoids that do not prolapse).      The exam was otherwise without abnormality on direct and retroflexion       views. Impression:            - The examined portion of the ileum was normal.                        - Internal hemorrhoids.                        - The examination was otherwise normal on direct and                         retroflexion views.                        - No specimens collected. Recommendation:        - Discharge patient to home.                        - Resume previous diet.                        - Continue present medications.                        - Repeat colonoscopy in 5 years for surveillance.                        - Return to referring physician as previously                         scheduled. Procedure Code(s):     --- Professional ---                        H9894, Colorectal cancer screening; colonoscopy on  individual at high risk Diagnosis Code(s):     --- Professional ---                        Z86.010, Personal history of colonic polyps                        K64.0, First degree hemorrhoids CPT copyright 2022 [employer]. All rights reserved. The codes documented in this report are preliminary and upon coder review may  be revised to meet current compliance requirements. Ole Schick MD, MD 08/03/2024 1:05:43 PM Number of Addenda: 0 Note Initiated On: 08/03/2024 12:23 PM Scope Withdrawal Time: 0 hours 6 minutes 56 seconds  Total Procedure Duration: 0 hours 10 minutes 43 seconds  Estimated Blood Loss:  Estimated blood loss: none.      [facility]

## 2024-08-03 NOTE — Interval H&P Note (Signed)
 History and Physical Interval Note:  08/03/2024 12:26 PM  Derrick Obrien  has presented today for surgery, with the diagnosis of bARRETT'S ESOPHAGUS .HX PF ADENOMATOUS POLYP OF COLON.  The various methods of treatment have been discussed with the patient and family. After consideration of risks, benefits and other options for treatment, the patient has consented to  Procedures: COLONOSCOPY (N/A) EGD (ESOPHAGOGASTRODUODENOSCOPY) (N/A) as a surgical intervention.  The patient's history has been reviewed, patient examined, no change in status, stable for surgery.  I have reviewed the patient's chart and labs.  Questions were answered to the patient's satisfaction.     Ole ONEIDA Schick  Ok to proceed with EGD/Colonoscopy

## 2024-08-03 NOTE — Anesthesia Preprocedure Evaluation (Signed)
 "                                  Anesthesia Evaluation  Patient identified by MRN, date of birth, ID band Patient awake    Reviewed: Allergy & Precautions, NPO status , Patient's Chart, lab work & pertinent test results  History of Anesthesia Complications Negative for: history of anesthetic complications  Airway Mallampati: III  TM Distance: <3 FB Neck ROM: full    Dental  (+) Chipped   Pulmonary neg pulmonary ROS, neg shortness of breath   Pulmonary exam normal        Cardiovascular Exercise Tolerance: Good hypertension, (-) angina Normal cardiovascular exam     Neuro/Psych negative neurological ROS  negative psych ROS   GI/Hepatic Neg liver ROS,GERD  Controlled,,  Endo/Other  diabetes, Type 2    Renal/GU negative Renal ROS  negative genitourinary   Musculoskeletal   Abdominal   Peds  Hematology negative hematology ROS (+)   Anesthesia Other Findings Past Medical History: No date: Actinic keratosis No date: Atypical mole     Comment:  L 5th finger  No date: Barrett esophagus No date: Colon polyps No date: Diabetes mellitus without complication (HCC) No date: Family history of breast cancer No date: Family history of leukemia No date: Family history of pancreatic cancer No date: Family history of prostate cancer No date: Hyperlipidemia No date: Hypertension No date: Personal history of colonic polyps No date: Vertigo  Past Surgical History: No date: COLONOSCOPY 09/17/2016: COLONOSCOPY WITH PROPOFOL ; N/A     Comment:  Procedure: COLONOSCOPY WITH PROPOFOL ;  Surgeon: Lamar ONEIDA Holmes, MD;  Location: [facility] ENDOSCOPY;  Service:               Endoscopy;  Laterality: N/A; 07/18/2020: COLONOSCOPY WITH PROPOFOL ; N/A     Comment:  Procedure: COLONOSCOPY WITH BIOPSY;  Surgeon: Jinny Carmine, MD;  Location: [facility];  Service:               Endoscopy;  Laterality: N/A;  priority 4 No date:  ESOPHAGOGASTRODUODENOSCOPY 09/17/2016: ESOPHAGOGASTRODUODENOSCOPY (EGD) WITH PROPOFOL ; N/A     Comment:  Procedure: ESOPHAGOGASTRODUODENOSCOPY (EGD) WITH               PROPOFOL ;  Surgeon: Lamar ONEIDA Holmes, MD;  Location: [facility]              ENDOSCOPY;  Service: Endoscopy;  Laterality: N/A; 07/18/2020: ESOPHAGOGASTRODUODENOSCOPY (EGD) WITH PROPOFOL ; N/A     Comment:  Procedure: ESOPHAGOGASTRODUODENOSCOPY (EGD) WITH BIOPSY;              Surgeon: Jinny Carmine, MD;  Location: [facility]               [facility];  Service: Endoscopy;  Laterality: N/A; 07/18/2020: POLYPECTOMY; N/A     Comment:  Procedure: POLYPECTOMY;  Surgeon: Jinny Carmine, MD;                Location: [facility];  Service: Endoscopy;                Laterality: N/A; No date: VASECTOMY  BMI    Body Mass Index: 29.23 kg/m      Reproductive/Obstetrics negative OB ROS  Anesthesia Physical Anesthesia Plan  ASA: 3  Anesthesia Plan: General   Post-op Pain Management:    Induction: Intravenous  PONV Risk Score and Plan: Propofol  infusion and TIVA  Airway Management Planned: Natural Airway and Nasal Cannula  Additional Equipment:   Intra-op Plan:   Post-operative Plan:   Informed Consent: I have reviewed the patients History and Physical, chart, labs and discussed the procedure including the risks, benefits and alternatives for the proposed anesthesia with the patient or authorized representative who has indicated his/her understanding and acceptance.     Dental Advisory Given  Plan Discussed with: Anesthesiologist, CRNA and Surgeon  Anesthesia Plan Comments: (Patient consented for risks of anesthesia including but not limited to:  - adverse reactions to medications - risk of airway placement if required - damage to eyes, teeth, lips or other oral mucosa - nerve damage due to positioning  - sore throat or hoarseness - Damage to heart, brain, nerves,  lungs, other parts of body or loss of life  Patient voiced understanding and assent.)        Anesthesia Quick Evaluation  "

## 2024-08-03 NOTE — H&P (Signed)
 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 Last colonoscopy in 2021 was unremarkable. No blood thinners. No family history of GI malignancies.   Current Medications[1]  Medications Prior to Admission  Medication Sig Dispense Refill Last Dose/Taking   aspirin EC 81 MG tablet Take 81 mg by mouth daily.   Past Week   ezetimibe-simvastatin (VYTORIN) 10-40 MG tablet Take 1 tablet by mouth daily.   Past Week   Multiple Vitamin (MULTI-VITAMINS) TABS Take 1 tablet by mouth daily.   Past Week   Niacin, Antihyperlipidemic, (NIASPAN PO) Take 500 mg by mouth daily.    Past Week   omega-3 acid ethyl esters (LOVAZA) 1 g capsule Take 1 g by mouth daily.    Past Week   omeprazole (PRILOSEC) 40 MG capsule Take 40 mg by mouth 2 (two) times daily.   08/02/2024   Telmisartan (MICARDIS PO) Take 1 tablet by mouth daily.    08/03/2024 at  7:30 AM   metFORMIN (GLUCOPHAGE) 500 MG tablet Take 500 mg by mouth daily.        Allergies[2]   Past Medical History:  Diagnosis Date   Actinic keratosis    Atypical mole    L 5th finger    Barrett esophagus    Colon polyps    Diabetes mellitus without complication (HCC)    Family history of breast cancer    Family history of leukemia    Family history of pancreatic cancer    Family history of prostate cancer    Hyperlipidemia    Hypertension    Personal history of colonic polyps    Vertigo     Review of systems:  Otherwise negative.    Physical Exam  Gen: Alert, oriented. Appears stated age.  HEENT: PERRLA. Lungs: No respiratory distress CV: RRR Abd: soft, benign, no masses Ext: No edema    Planned procedures: Proceed with  EGD/colonoscopy. The patient understands the nature of the planned procedure, indications, risks, alternatives and potential complications including but not limited to bleeding, infection, perforation, damage to internal organs and possible oversedation/side effects from anesthesia. The patient agrees and gives consent to proceed.  Please refer to procedure notes for findings, recommendations and patient disposition/instructions.     Ole ONEIDA Schick, MD [facility]         [1]  Current Facility-Administered Medications:    0.9 %  sodium chloride  infusion, , Intravenous, Continuous, Bladyn Tipps, Ole ONEIDA, MD, Last Rate: 20 mL/hr at 08/03/24 1223, Continued from Pre-op at 08/03/24 1223 [2] No Known Allergies

## 2024-08-03 NOTE — Transfer of Care (Signed)
 Immediate Anesthesia Transfer of Care Note  Patient: Derrick Obrien  Procedure(s) Performed: COLONOSCOPY EGD (ESOPHAGOGASTRODUODENOSCOPY)  Patient Location: PACU  Anesthesia Type:General  Level of Consciousness: awake, alert , oriented, and patient cooperative  Airway & Oxygen Therapy: Patient Spontanous Breathing  Post-op Assessment: Report given to RN, Post -op Vital signs reviewed and stable, and Patient moving all extremities X 4  Post vital signs: Reviewed and stable  Last Vitals:  Vitals Value Taken Time  BP 96/58 08/03/24 13:04  Temp 36.1 C 08/03/24 13:04  Pulse 79 08/03/24 13:04  Resp 18 08/03/24 13:04  SpO2 100 % 08/03/24 13:04  Vitals shown include unfiled device data.  Last Pain:  Vitals:   08/03/24 1304  TempSrc: Temporal  PainSc: 0-No pain         Complications: No notable events documented.

## 2024-08-06 LAB — SURGICAL PATHOLOGY

## 2024-10-23 ENCOUNTER — Ambulatory Visit: Admitting: Dermatology
